# Patient Record
Sex: Female | Born: 1955 | Race: White | Hispanic: Yes | Marital: Married | Smoking: Never smoker
Health system: Southern US, Community
[De-identification: ages and names within clinical notes are randomized; demographics above are authoritative.]

## PROBLEM LIST (undated history)

## (undated) DIAGNOSIS — C50919 Malignant neoplasm of unspecified site of unspecified female breast: Secondary | ICD-10-CM

## (undated) DIAGNOSIS — E119 Type 2 diabetes mellitus without complications: Secondary | ICD-10-CM

## (undated) DIAGNOSIS — M1990 Unspecified osteoarthritis, unspecified site: Secondary | ICD-10-CM

## (undated) DIAGNOSIS — D649 Anemia, unspecified: Secondary | ICD-10-CM

## (undated) DIAGNOSIS — N189 Chronic kidney disease, unspecified: Secondary | ICD-10-CM

## (undated) HISTORY — PX: CHOLECYSTECTOMY: SHX55

---

## 1994-02-01 HISTORY — PX: MASTECTOMY, RADICAL: SHX710

## 2006-12-05 ENCOUNTER — Ambulatory Visit: Payer: Self-pay | Admitting: Gastroenterology

## 2014-11-26 ENCOUNTER — Other Ambulatory Visit: Payer: Self-pay | Admitting: Orthopedic Surgery

## 2014-11-26 DIAGNOSIS — M542 Cervicalgia: Secondary | ICD-10-CM

## 2014-12-02 ENCOUNTER — Encounter
Admission: RE | Admit: 2014-12-02 | Discharge: 2014-12-02 | Disposition: A | Discharge status: Home / Self Care | Payer: Managed Care, Other (non HMO) | Source: Ambulatory Visit | Attending: Orthopedic Surgery | Admitting: Orthopedic Surgery

## 2014-12-02 ENCOUNTER — Encounter: Admission: RE | Admit: 2014-12-02 | Payer: Managed Care, Other (non HMO) | Source: Ambulatory Visit

## 2014-12-02 DIAGNOSIS — M542 Cervicalgia: Secondary | ICD-10-CM | POA: Insufficient documentation

## 2014-12-02 HISTORY — DX: Malignant neoplasm of unspecified site of unspecified female breast: C50.919

## 2014-12-02 MED ORDER — TECHNETIUM TC 99M MEDRONATE IV KIT
25.0000 | PACK | Freq: Once | INTRAVENOUS | Status: AC | PRN
  Administered 2014-12-02: 22.53 via INTRAVENOUS

## 2019-04-13 ENCOUNTER — Ambulatory Visit: Payer: Self-pay | Attending: Internal Medicine

## 2019-04-13 DIAGNOSIS — Z23 Encounter for immunization: Secondary | ICD-10-CM

## 2019-04-13 NOTE — Progress Notes (Signed)
   Covid-19 Vaccination Clinic  Name:  Belinda Frazier    MRN: [id number] DOB: [birth year]  04/13/2019  [name] was observed post Covid-19 immunization for 15 minutes without incident. She was provided with Vaccine Information Sheet and instruction to access the V-Safe system.   [name] was instructed to call 911 with any severe reactions post vaccine: Marland Kitchen Difficulty breathing  . Swelling of face and throat  . A fast heartbeat  . A bad rash all over body  . Dizziness and weakness   Immunizations Administered    Name Date Dose VIS Date Route   Moderna COVID-19 Vaccine 04/13/2019 10:37 AM 0.5 mL 01/02/2019 Intramuscular   Manufacturer: Moderna   Lot: [id number]   [id number]

## 2019-05-15 ENCOUNTER — Ambulatory Visit: Payer: Self-pay | Attending: Internal Medicine

## 2019-05-15 DIAGNOSIS — Z23 Encounter for immunization: Secondary | ICD-10-CM

## 2019-05-15 NOTE — Progress Notes (Signed)
   Covid-19 Vaccination Clinic  Name:  Naysha Tuffy    MRN: [id number] DOB: 1955-04-04  05/15/2019  Ms. Bosque was observed post Covid-19 immunization for 15 minutes without incident. She was provided with Vaccine Information Sheet and instruction to access the V-Safe system.   Ms. Panepinto was instructed to call 911 with any severe reactions post vaccine: Marland Kitchen Difficulty breathing  . Swelling of face and throat  . A fast heartbeat  . A bad rash all over body  . Dizziness and weakness   Immunizations Administered    Name Date Dose VIS Date Route   Moderna COVID-19 Vaccine 05/15/2019 10:11 AM 0.5 mL 01/02/2019 Intramuscular   Manufacturer: Moderna   Lot: [id number]   [id number]

## 2019-10-26 ENCOUNTER — Other Ambulatory Visit: Payer: Self-pay

## 2019-10-26 ENCOUNTER — Other Ambulatory Visit: Payer: Self-pay | Admitting: Internal Medicine

## 2019-10-26 ENCOUNTER — Ambulatory Visit
Admission: RE | Admit: 2019-10-26 | Discharge: 2019-10-26 | Disposition: A | Discharge status: Home / Self Care | Payer: Medicare HMO | Source: Ambulatory Visit | Attending: Internal Medicine | Admitting: Internal Medicine

## 2019-10-26 DIAGNOSIS — R1084 Generalized abdominal pain: Secondary | ICD-10-CM | POA: Diagnosis not present

## 2019-10-26 LAB — POCT I-STAT CREATININE: Creatinine, Ser: 1 mg/dL (ref 0.44–1.00)

## 2019-10-26 MED ORDER — IOHEXOL 300 MG/ML  SOLN
100.0000 mL | Freq: Once | INTRAMUSCULAR | Status: AC | PRN
  Administered 2019-10-26: 100 mL via INTRAVENOUS

## 2021-01-20 ENCOUNTER — Other Ambulatory Visit: Payer: Self-pay | Admitting: Internal Medicine

## 2021-01-20 DIAGNOSIS — M79662 Pain in left lower leg: Secondary | ICD-10-CM

## 2021-01-20 DIAGNOSIS — E1165 Type 2 diabetes mellitus with hyperglycemia: Secondary | ICD-10-CM

## 2021-01-29 ENCOUNTER — Ambulatory Visit
Admission: RE | Admit: 2021-01-29 | Discharge: 2021-01-29 | Disposition: A | Discharge status: Home / Self Care | Payer: Medicare HMO | Source: Ambulatory Visit | Attending: Internal Medicine | Admitting: Internal Medicine

## 2021-01-29 DIAGNOSIS — M79662 Pain in left lower leg: Secondary | ICD-10-CM | POA: Diagnosis not present

## 2021-01-29 DIAGNOSIS — Z794 Long term (current) use of insulin: Secondary | ICD-10-CM | POA: Diagnosis present

## 2021-01-29 DIAGNOSIS — E1165 Type 2 diabetes mellitus with hyperglycemia: Secondary | ICD-10-CM | POA: Insufficient documentation

## 2021-09-05 LAB — EXTERNAL GENERIC LAB PROCEDURE: COLOGUARD: NEGATIVE

## 2021-09-05 LAB — COLOGUARD: COLOGUARD: NEGATIVE

## 2021-10-19 ENCOUNTER — Other Ambulatory Visit: Payer: Self-pay | Admitting: Nephrology

## 2021-10-19 ENCOUNTER — Other Ambulatory Visit
Admission: RE | Admit: 2021-10-19 | Discharge: 2021-10-19 | Disposition: A | Discharge status: Home / Self Care | Payer: Medicare HMO | Attending: Nephrology | Admitting: Nephrology

## 2021-10-19 DIAGNOSIS — E1122 Type 2 diabetes mellitus with diabetic chronic kidney disease: Secondary | ICD-10-CM | POA: Insufficient documentation

## 2021-10-19 DIAGNOSIS — N1832 Chronic kidney disease, stage 3b: Secondary | ICD-10-CM | POA: Insufficient documentation

## 2021-10-19 LAB — CBC WITH DIFFERENTIAL/PLATELET
Abs Immature Granulocytes: 0.01 10*3/uL (ref 0.00–0.07)
Basophils Absolute: 0.1 10*3/uL (ref 0.0–0.1)
Basophils Relative: 1 %
Eosinophils Absolute: 0.2 10*3/uL (ref 0.0–0.5)
Eosinophils Relative: 4 %
HCT: 31.9 % — ABNORMAL LOW (ref 36.0–46.0)
Hemoglobin: 10.5 g/dL — ABNORMAL LOW (ref 12.0–15.0)
Immature Granulocytes: 0 %
Lymphocytes Relative: 17 %
Lymphs Abs: 0.9 10*3/uL (ref 0.7–4.0)
MCH: 30.5 pg (ref 26.0–34.0)
MCHC: 32.9 g/dL (ref 30.0–36.0)
MCV: 92.7 fL (ref 80.0–100.0)
Monocytes Absolute: 0.4 10*3/uL (ref 0.1–1.0)
Monocytes Relative: 7 %
Neutro Abs: 3.7 10*3/uL (ref 1.7–7.7)
Neutrophils Relative %: 71 %
Platelets: 310 10*3/uL (ref 150–400)
RBC: 3.44 MIL/uL — ABNORMAL LOW (ref 3.87–5.11)
RDW: 14.6 % (ref 11.5–15.5)
WBC: 5.2 10*3/uL (ref 4.0–10.5)
nRBC: 0 % (ref 0.0–0.2)

## 2021-10-19 LAB — RENAL FUNCTION PANEL
Albumin: 4.2 g/dL (ref 3.5–5.0)
Anion gap: 7 (ref 5–15)
BUN: 39 mg/dL — ABNORMAL HIGH (ref 8–23)
CO2: 23 mmol/L (ref 22–32)
Calcium: 9.5 mg/dL (ref 8.9–10.3)
Chloride: 109 mmol/L (ref 98–111)
Creatinine, Ser: 1.32 mg/dL — ABNORMAL HIGH (ref 0.44–1.00)
GFR, Estimated: 44 mL/min — ABNORMAL LOW (ref >60)
Glucose, Bld: 137 mg/dL — ABNORMAL HIGH (ref 70–99)
Phosphorus: 3.1 mg/dL (ref 2.5–4.6)
Potassium: 4.7 mmol/L (ref 3.5–5.1)
Sodium: 139 mmol/L (ref 135–145)

## 2021-10-20 LAB — MICROALBUMIN / CREATININE URINE RATIO
Creatinine, Urine: 109.6 mg/dL
Microalb Creat Ratio: 12 mg/g creat (ref 0–29)
Microalb, Ur: 13.4 ug/mL — ABNORMAL HIGH

## 2021-10-21 LAB — PARATHYROID HORMONE, INTACT (NO CA): PTH: 22 pg/mL (ref 15–65)

## 2021-10-27 ENCOUNTER — Ambulatory Visit
Admission: RE | Admit: 2021-10-27 | Discharge: 2021-10-27 | Disposition: A | Discharge status: Home / Self Care | Payer: Medicare HMO | Source: Ambulatory Visit | Attending: Nephrology | Admitting: Nephrology

## 2021-10-27 DIAGNOSIS — N1832 Chronic kidney disease, stage 3b: Secondary | ICD-10-CM | POA: Diagnosis present

## 2021-10-27 DIAGNOSIS — E1122 Type 2 diabetes mellitus with diabetic chronic kidney disease: Secondary | ICD-10-CM | POA: Diagnosis not present

## 2022-12-08 NOTE — Patient Instructions (Addendum)
DUE TO COVID-19 ONLY TWO VISITORS  (aged 69 and older)  ARE ALLOWED TO COME WITH YOU AND STAY IN THE WAITING ROOM ONLY DURING PRE OP AND PROCEDURE.   **NO VISITORS ARE ALLOWED IN THE SHORT STAY AREA OR RECOVERY ROOM!!**  IF YOU WILL BE ADMITTED INTO THE HOSPITAL YOU ARE ALLOWED ONLY FOUR SUPPORT PEOPLE DURING VISITATION HOURS ONLY (7 AM -8PM)   The support person(s) must pass our screening, gel in and out, and wear a mask at all times, including in the patient's room. Patients must also wear a mask when staff or their support person are in the room. Visitors GUEST BADGE MUST BE WORN VISIBLY  One adult visitor may remain with you overnight and MUST be in the room by 8 P.M.     Your procedure is scheduled on: 12/21/22   Report to [facility] Main Entrance    Report to admitting at : 5:15 AM   Call this number if you have problems the morning of surgery [phone]   Do not eat food :After Midnight.   After Midnight you may have the following liquids until : 4:00 AM DAY OF SURGERY  Water Black Coffee (sugar ok, NO MILK/CREAM OR CREAMERS)  Tea (sugar ok, NO MILK/CREAM OR CREAMERS) regular and decaf                             Plain Jell-O (NO RED)                                           Fruit ices (not with fruit pulp, NO RED)                                     Popsicles (NO RED)                                                                  Juice: apple, WHITE grape, WHITE cranberry Sports drinks like Gatorade (NO RED)   The day of surgery:  Drink ONE (1) Pre-Surgery Clear G2 at : 4;00 AM the morning of surgery. Drink in one sitting. Do not sip.  This drink was given to you during your hospital  pre-op appointment visit. Nothing else to drink after completing the  Pre-Surgery Clear Ensure or G2.          If you have questions, please contact your surgeon's office.  FOLLOW  ANY ADDITIONAL PRE OP INSTRUCTIONS YOU RECEIVED FROM YOUR SURGEON'S OFFICE!!!   Oral  Hygiene is also important to reduce your risk of infection.                                    Remember - BRUSH YOUR TEETH THE MORNING OF SURGERY WITH YOUR REGULAR TOOTHPASTE  DENTURES WILL BE REMOVED PRIOR TO SURGERY PLEASE DO NOT APPLY "Poly grip" OR ADHESIVES!!!   Do NOT smoke after Midnight   Take these medicines the morning of  surgery with A SIP OF WATER: NONE How to Manage Your Diabetes Before and After Surgery  Why is it important to control my blood sugar before and after surgery? Improving blood sugar levels before and after surgery helps healing and can limit problems. A way of improving blood sugar control is eating a healthy diet by:  Eating less sugar and carbohydrates  Increasing activity/exercise  Talking with your doctor about reaching your blood sugar goals High blood sugars (greater than 180 mg/dL) can raise your risk of infections and slow your recovery, so you will need to focus on controlling your diabetes during the weeks before surgery. Make sure that the doctor who takes care of your diabetes knows about your planned surgery including the date and location.  How do I manage my blood sugar before surgery? Check your blood sugar at least 4 times a day, starting 2 days before surgery, to make sure that the level is not too high or low. Check your blood sugar the morning of your surgery when you wake up and every 2 hours until you get to the Short Stay unit. If your blood sugar is less than 70 mg/dL, you will need to treat for low blood sugar: Do not take insulin. Treat a low blood sugar (less than 70 mg/dL) with  cup of clear juice (cranberry or apple), 4 glucose tablets, OR glucose gel. Recheck blood sugar in 15 minutes after treatment (to make sure it is greater than 70 mg/dL). If your blood sugar is not greater than 70 mg/dL on recheck, call [phone] for further instructions. Report your blood sugar to the short stay nurse when you get to Short Stay.  If you  are admitted to the hospital after surgery: Your blood sugar will be checked by the staff and you will probably be given insulin after surgery (instead of oral diabetes medicines) to make sure you have good blood sugar levels. The goal for blood sugar control after surgery is 80-180 mg/dL.   WHAT DO I DO ABOUT MY DIABETES MEDICATION?      THE MORNING OF SURGERY, DO NOT TAKE ANY ORAL DIABETIC MEDICATIONS DAY OF YOUR SURGERY  DO NOT TAKE THE FOLLOWING 7 DAYS PRIOR TO SURGERY: Ozempic, Wegovy, Rybelsus (Semaglutide), Byetta (exenatide), Bydureon (exenatide ER), Victoza, Saxenda (liraglutide), or Trulicity (dulaglutide) Mounjaro (Tirzepatide) Adlyxin (Lixisenatide), Polyethylene Glycol Loxenatide. HOLD Mounjaro after: 12/11/22 .                              You may not have any metal on your body including hair pins, jewelry, and body piercing             Do not wear make-up, lotions, powders, perfumes/cologne, or deodorant  Do not wear nail polish including gel and S&S, artificial/acrylic nails, or any other type of covering on natural nails including finger and toenails. If you have artificial nails, gel coating, etc. that needs to be removed by a nail salon please have this removed prior to surgery or surgery may need to be canceled/ delayed if the surgeon/ anesthesia feels like they are unable to be safely monitored.   Do not shave  48 hours prior to surgery.    Do not bring valuables to the hospital. [facility] IS NOT             RESPONSIBLE   FOR VALUABLES.   Contacts, glasses, or bridgework may not be worn into surgery.  Bring small overnight bag day of surgery.   DO NOT BRING YOUR HOME MEDICATIONS TO THE HOSPITAL. PHARMACY WILL DISPENSE MEDICATIONS LISTED ON YOUR MEDICATION LIST TO YOU DURING YOUR ADMISSION IN THE HOSPITAL!    Patients discharged on the day of surgery will not be allowed to drive home.  Someone NEEDS to stay with you for the first 24 hours after  anesthesia.   Special Instructions: Bring a copy of your healthcare power of attorney and living will documents         the day of surgery if you haven't scanned them before.              Please read over the following fact sheets you were given: IF YOU HAVE QUESTIONS ABOUT YOUR PRE-OP INSTRUCTIONS PLEASE CALL [phone]      Pre-operative 5 CHG Bath Instructions   You can play a key role in reducing the risk of infection after surgery. Your skin needs to be as free of germs as possible. You can reduce the number of germs on your skin by washing with CHG (chlorhexidine gluconate) soap before surgery. CHG is an antiseptic soap that kills germs and continues to kill germs even after washing.   DO NOT use if you have an allergy to chlorhexidine/CHG or antibacterial soaps. If your skin becomes reddened or irritated, stop using the CHG and notify one of our RNs at : [phone].   Please shower with the CHG soap starting 4 days before surgery using the following schedule:     Please keep in mind the following:  DO NOT shave, including legs and underarms, starting the day of your first shower.   You may shave your face at any point before/day of surgery.  Place clean sheets on your bed the day you start using CHG soap. Use a clean washcloth (not used since being washed) for each shower. DO NOT sleep with pets once you start using the CHG.   CHG Shower Instructions:  If you choose to wash your hair and private area, wash first with your normal shampoo/soap.  After you use shampoo/soap, rinse your hair and body thoroughly to remove shampoo/soap residue.  Turn the water OFF and apply about 3 tablespoons (45 ml) of CHG soap to a CLEAN washcloth.  Apply CHG soap ONLY FROM YOUR NECK DOWN TO YOUR TOES (washing for 3-5 minutes)  DO NOT use CHG soap on face, private areas, open wounds, or sores.  Pay special attention to the area where your surgery is being performed.  If you are having back  surgery, having someone wash your back for you may be helpful. Wait 2 minutes after CHG soap is applied, then you may rinse off the CHG soap.  Pat dry with a clean towel  Put on clean clothes/pajamas   If you choose to wear lotion, please use ONLY the CHG-compatible lotions on the back of this paper.     Additional instructions for the day of surgery: DO NOT APPLY any lotions, deodorants, cologne, or perfumes.   Put on clean/comfortable clothes.  Brush your teeth.  Ask your nurse before applying any prescription medications to the skin.   CHG Compatible Lotions   Aveeno Moisturizing lotion  Cetaphil Moisturizing Cream  Cetaphil Moisturizing Lotion  Clairol Herbal Essence Moisturizing Lotion, Dry Skin  Clairol Herbal Essence Moisturizing Lotion, Extra Dry Skin  Clairol Herbal Essence Moisturizing Lotion, Normal Skin  Curel Age Defying Therapeutic Moisturizing Lotion with Alpha Hydroxy  Curel Extreme  Care Body Lotion  Curel Soothing Hands Moisturizing Hand Lotion  Curel Therapeutic Moisturizing Cream, Fragrance-Free  Curel Therapeutic Moisturizing Lotion, Fragrance-Free  Curel Therapeutic Moisturizing Lotion, Original Formula  Eucerin Daily Replenishing Lotion  Eucerin Dry Skin Therapy Plus Alpha Hydroxy Crme  Eucerin Dry Skin Therapy Plus Alpha Hydroxy Lotion  Eucerin Original Crme  Eucerin Original Lotion  Eucerin Plus Crme Eucerin Plus Lotion  Eucerin TriLipid Replenishing Lotion  Keri Anti-Bacterial Hand Lotion  Keri Deep Conditioning Original Lotion Dry Skin Formula Softly Scented  Keri Deep Conditioning Original Lotion, Fragrance Free Sensitive Skin Formula  Keri Lotion Fast Absorbing Fragrance Free Sensitive Skin Formula  Keri Lotion Fast Absorbing Softly Scented Dry Skin Formula  Keri Original Lotion  Keri Skin Renewal Lotion Keri Silky Smooth Lotion  Keri Silky Smooth Sensitive Skin Lotion  Nivea Body Creamy Conditioning Oil  Nivea Body Extra Enriched Lotion   Nivea Body Original Lotion  Nivea Body Sheer Moisturizing Lotion Nivea Crme  Nivea Skin Firming Lotion  NutraDerm 30 Skin Lotion  NutraDerm Skin Lotion  NutraDerm Therapeutic Skin Cream  NutraDerm Therapeutic Skin Lotion  ProShield Protective Hand Cream  Provon moisturizing lotion   Incentive Spirometer  An incentive spirometer is a tool that can help keep your lungs clear and active. This tool measures how well you are filling your lungs with each breath. Taking long deep breaths may help reverse or decrease the chance of developing breathing (pulmonary) problems (especially infection) following: A long period of time when you are unable to move or be active. BEFORE THE PROCEDURE  If the spirometer includes an indicator to show your best effort, your nurse or respiratory therapist will set it to a desired goal. If possible, sit up straight or lean slightly forward. Try not to slouch. Hold the incentive spirometer in an upright position. INSTRUCTIONS FOR USE  Sit on the edge of your bed if possible, or sit up as far as you can in bed or on a chair. Hold the incentive spirometer in an upright position. Breathe out normally. Place the mouthpiece in your mouth and seal your lips tightly around it. Breathe in slowly and as deeply as possible, raising the piston or the ball toward the top of the column. Hold your breath for 3-5 seconds or for as long as possible. Allow the piston or ball to fall to the bottom of the column. Remove the mouthpiece from your mouth and breathe out normally. Rest for a few seconds and repeat Steps 1 through 7 at least 10 times every 1-2 hours when you are awake. Take your time and take a few normal breaths between deep breaths. The spirometer may include an indicator to show your best effort. Use the indicator as a goal to work toward during each repetition. After each set of 10 deep breaths, practice coughing to be sure your lungs are clear. If you have an  incision (the cut made at the time of surgery), support your incision when coughing by placing a pillow or rolled up towels firmly against it. Once you are able to get out of bed, walk around indoors and cough well. You may stop using the incentive spirometer when instructed by your caregiver.  RISKS AND COMPLICATIONS Take your time so you do not get dizzy or light-headed. If you are in pain, you may need to take or ask for pain medication before doing incentive spirometry. It is harder to take a deep breath if you are having pain. AFTER USE Rest and breathe  slowly and easily. It can be helpful to keep track of a log of your progress. Your caregiver can provide you with a simple table to help with this. If you are using the spirometer at home, follow these instructions: SEEK MEDICAL CARE IF:  You are having difficultly using the spirometer. You have trouble using the spirometer as often as instructed. Your pain medication is not giving enough relief while using the spirometer. You develop fever of 100.5 F (38.1 C) or higher. SEEK IMMEDIATE MEDICAL CARE IF:  You cough up bloody sputum that had not been present before. You develop fever of 102 F (38.9 C) or greater. You develop worsening pain at or near the incision site. MAKE SURE YOU:  Understand these instructions. Will watch your condition. Will get help right away if you are not doing well or get worse. Document Released: 05/31/2006 Document Revised: 04/12/2011 Document Reviewed: 08/01/2006 [facility] Patient Information 2014 [place], [place].   ________________________________________________________________________

## 2022-12-09 ENCOUNTER — Encounter (HOSPITAL_COMMUNITY): Payer: Self-pay

## 2022-12-09 ENCOUNTER — Encounter (HOSPITAL_COMMUNITY)
Admission: RE | Admit: 2022-12-09 | Discharge: 2022-12-09 | Disposition: A | Discharge status: Home / Self Care | Payer: Medicare HMO | Source: Ambulatory Visit | Attending: Orthopedic Surgery | Admitting: Orthopedic Surgery

## 2022-12-09 ENCOUNTER — Other Ambulatory Visit: Payer: Self-pay

## 2022-12-09 VITALS — BP 127/61 | HR 80 | Temp 97.9°F | Ht 59.0 in | Wt 126.0 lb

## 2022-12-09 DIAGNOSIS — Z01818 Encounter for other preprocedural examination: Secondary | ICD-10-CM | POA: Insufficient documentation

## 2022-12-09 DIAGNOSIS — Z794 Long term (current) use of insulin: Secondary | ICD-10-CM | POA: Diagnosis not present

## 2022-12-09 DIAGNOSIS — I444 Left anterior fascicular block: Secondary | ICD-10-CM | POA: Diagnosis not present

## 2022-12-09 DIAGNOSIS — E119 Type 2 diabetes mellitus without complications: Secondary | ICD-10-CM | POA: Diagnosis not present

## 2022-12-09 DIAGNOSIS — I10 Essential (primary) hypertension: Secondary | ICD-10-CM

## 2022-12-09 HISTORY — DX: Anemia, unspecified: D64.9

## 2022-12-09 HISTORY — DX: Unspecified osteoarthritis, unspecified site: M19.90

## 2022-12-09 HISTORY — DX: Chronic kidney disease, unspecified: N18.9

## 2022-12-09 HISTORY — DX: Type 2 diabetes mellitus without complications: E11.9

## 2022-12-09 LAB — GLUCOSE, CAPILLARY: Glucose-Capillary: 119 mg/dL — ABNORMAL HIGH (ref 70–99)

## 2022-12-09 LAB — SURGICAL PCR SCREEN
MRSA, PCR: NEGATIVE
Staphylococcus aureus: NEGATIVE

## 2022-12-09 NOTE — Progress Notes (Addendum)
For Anesthesia: PCP - Enid Baas, MD  Cardiologist -   Bowel Prep reminder:  Chest x-ray -  EKG - 12/09/22 Stress Test -  ECHO -  Cardiac Cath -  Pacemaker/ICD device last checked: Pacemaker orders received: Device Rep notified:  Spinal Cord Stimulator: N/A  Sleep Study - N/A CPAP -   Fasting Blood Sugar - 80' - 100's Checks Blood Sugar __1___ times a day Date and result of last Hgb A1c- 6 : 11/26/22  Last dose of GLP1 agonist- mounjaro. GLP1 instructions: To hold it after 12/11/22 dose.  Last dose of SGLT-2 inhibitors- N/A SGLT-2 instructions:   Blood Thinner Instructions: N/A Aspirin Instructions: To hold 5 days before surgery. Last Dose:  Activity level: Can go up a flight of stairs and activities of daily living without stopping and without chest pain and/or shortness of breath   Able to exercise without chest pain and/or shortness of breath  Anesthesia review: Hx: DIA,CKD III  Patient denies shortness of breath, fever, cough and chest pain at PAT appointment   Patient verbalized understanding of instructions that were given to them at the PAT appointment. Patient was also instructed that they will need to review over the PAT instructions again at home before surgery.

## 2022-12-20 NOTE — Anesthesia Preprocedure Evaluation (Signed)
Anesthesia Evaluation  Patient identified by MRN, date of birth, ID band Patient awake    Reviewed: Allergy & Precautions, NPO status , Patient's Chart, lab work & pertinent test results  Airway Mallampati: II  TM Distance: >3 FB Neck ROM: Full    Dental  (+) Teeth Intact, Dental Advisory Given, Caps,    Pulmonary neg pulmonary ROS   Pulmonary exam normal breath sounds clear to auscultation       Cardiovascular hypertension, Pt. on medications Normal cardiovascular exam Rhythm:Regular Rate:Normal     Neuro/Psych negative neurological ROS     GI/Hepatic negative GI ROS, Neg liver ROS,,,  Endo/Other  diabetes, Type 2, Oral Hypoglycemic Agents    Renal/GU Renal InsufficiencyRenal disease     Musculoskeletal  (+) Arthritis ,    Abdominal   Peds  Hematology negative hematology ROS (+)   Anesthesia Other Findings S/p bilateral mastectomy  Reproductive/Obstetrics                             Anesthesia Physical Anesthesia Plan  ASA: 2  Anesthesia Plan: Spinal   Post-op Pain Management: Regional block* and Tylenol PO (pre-op)*   Induction: Intravenous  PONV Risk Score and Plan: 2 and Midazolam, Dexamethasone, Ondansetron and TIVA  Airway Management Planned: Natural Airway and Simple Face Mask  Additional Equipment:   Intra-op Plan:   Post-operative Plan:   Informed Consent: I have reviewed the patients History and Physical, chart, labs and discussed the procedure including the risks, benefits and alternatives for the proposed anesthesia with the patient or authorized representative who has indicated his/her understanding and acceptance.     Dental advisory given  Plan Discussed with: CRNA  Anesthesia Plan Comments:        Anesthesia Quick Evaluation

## 2022-12-21 ENCOUNTER — Encounter (HOSPITAL_COMMUNITY): Payer: Self-pay | Admitting: Orthopedic Surgery

## 2022-12-21 ENCOUNTER — Observation Stay (HOSPITAL_COMMUNITY)
Admission: RE | Admit: 2022-12-21 | Discharge: 2022-12-22 | Disposition: A | Discharge status: Home / Self Care | Payer: Medicare HMO | Source: Ambulatory Visit | Attending: Orthopedic Surgery | Admitting: Orthopedic Surgery

## 2022-12-21 ENCOUNTER — Encounter (HOSPITAL_COMMUNITY)
Admission: RE | Disposition: A | Discharge status: Home / Self Care | Payer: Self-pay | Source: Ambulatory Visit | Attending: Orthopedic Surgery

## 2022-12-21 ENCOUNTER — Ambulatory Visit (HOSPITAL_COMMUNITY): Payer: Medicare HMO | Admitting: Anesthesiology

## 2022-12-21 ENCOUNTER — Other Ambulatory Visit: Payer: Self-pay

## 2022-12-21 ENCOUNTER — Ambulatory Visit (HOSPITAL_COMMUNITY): Payer: Self-pay | Admitting: Anesthesiology

## 2022-12-21 DIAGNOSIS — M1712 Unilateral primary osteoarthritis, left knee: Secondary | ICD-10-CM | POA: Diagnosis present

## 2022-12-21 DIAGNOSIS — E1122 Type 2 diabetes mellitus with diabetic chronic kidney disease: Secondary | ICD-10-CM | POA: Insufficient documentation

## 2022-12-21 DIAGNOSIS — N183 Chronic kidney disease, stage 3 unspecified: Secondary | ICD-10-CM | POA: Insufficient documentation

## 2022-12-21 DIAGNOSIS — Z853 Personal history of malignant neoplasm of breast: Secondary | ICD-10-CM | POA: Insufficient documentation

## 2022-12-21 DIAGNOSIS — Z96652 Presence of left artificial knee joint: Principal | ICD-10-CM

## 2022-12-21 HISTORY — PX: TOTAL KNEE ARTHROPLASTY: SHX125

## 2022-12-21 LAB — GLUCOSE, CAPILLARY
Glucose-Capillary: 118 mg/dL — ABNORMAL HIGH (ref 70–99)
Glucose-Capillary: 99 mg/dL (ref 70–99)
Glucose-Capillary: 124 mg/dL — ABNORMAL HIGH (ref 70–99)
Glucose-Capillary: 165 mg/dL — ABNORMAL HIGH (ref 70–99)
Glucose-Capillary: 316 mg/dL — ABNORMAL HIGH (ref 70–99)
Glucose-Capillary: 262 mg/dL — ABNORMAL HIGH (ref 70–99)

## 2022-12-21 LAB — HEMOGLOBIN A1C
Hgb A1c MFr Bld: 5.9 % — ABNORMAL HIGH (ref 4.8–5.6)
Mean Plasma Glucose: 122.63 mg/dL

## 2022-12-21 SURGERY — ARTHROPLASTY, KNEE, TOTAL
Anesthesia: Spinal | Site: Knee | Laterality: Left

## 2022-12-21 MED ORDER — ALUM & MAG HYDROXIDE-SIMETH 200-200-20 MG/5ML PO SUSP: 30.0000 mL | ORAL | Status: DC | PRN

## 2022-12-21 MED ORDER — CEFAZOLIN SODIUM-DEXTROSE 2-4 GM/100ML-% IV SOLN
2.0000 g | INTRAVENOUS | Status: AC
  Administered 2022-12-21: 2 g via INTRAVENOUS
  Filled 2022-12-21: qty 100

## 2022-12-21 MED ORDER — PHENYLEPHRINE HCL-NACL 20-0.9 MG/250ML-% IV SOLN
INTRAVENOUS | Status: AC
  Filled 2022-12-21: qty 250

## 2022-12-21 MED ORDER — LACTATED RINGERS IV SOLN: INTRAVENOUS | Status: DC

## 2022-12-21 MED ORDER — TRANEXAMIC ACID-NACL 1000-0.7 MG/100ML-% IV SOLN
1000.0000 mg | INTRAVENOUS | Status: AC
  Administered 2022-12-21: 1000 mg via INTRAVENOUS
  Filled 2022-12-21: qty 100

## 2022-12-21 MED ORDER — SODIUM CHLORIDE 0.9% FLUSH
10.0000 mL | Freq: Two times a day (BID) | INTRAVENOUS | Status: DC
  Administered 2022-12-22: 10 mL via INTRAVENOUS

## 2022-12-21 MED ORDER — PROPOFOL 1000 MG/100ML IV EMUL
INTRAVENOUS | Status: AC
  Filled 2022-12-21: qty 100

## 2022-12-21 MED ORDER — ONDANSETRON HCL 4 MG PO TABS: 4.0000 mg | ORAL_TABLET | Freq: Four times a day (QID) | ORAL | Status: DC | PRN

## 2022-12-21 MED ORDER — MIDAZOLAM HCL 5 MG/5ML IJ SOLN
INTRAMUSCULAR | Status: DC | PRN
  Administered 2022-12-21 (×2): 1 mg via INTRAVENOUS

## 2022-12-21 MED ORDER — STERILE WATER FOR IRRIGATION IR SOLN
Status: DC | PRN
  Administered 2022-12-21: 1000 mL

## 2022-12-21 MED ORDER — SODIUM CHLORIDE (PF) 0.9 % IJ SOLN
INTRAMUSCULAR | Status: DC | PRN
  Administered 2022-12-21: 30 mL

## 2022-12-21 MED ORDER — CEFAZOLIN SODIUM-DEXTROSE 2-4 GM/100ML-% IV SOLN
2.0000 g | Freq: Four times a day (QID) | INTRAVENOUS | Status: AC
  Administered 2022-12-21 (×2): 2 g via INTRAVENOUS
  Filled 2022-12-21 (×2): qty 100

## 2022-12-21 MED ORDER — KETOROLAC TROMETHAMINE 30 MG/ML IJ SOLN
INTRAMUSCULAR | Status: AC
  Filled 2022-12-21: qty 1

## 2022-12-21 MED ORDER — DEXAMETHASONE SODIUM PHOSPHATE 10 MG/ML IJ SOLN
INTRAMUSCULAR | Status: AC
  Filled 2022-12-21: qty 1

## 2022-12-21 MED ORDER — 0.9 % SODIUM CHLORIDE (POUR BTL) OPTIME
TOPICAL | Status: DC | PRN
  Administered 2022-12-21: 1000 mL

## 2022-12-21 MED ORDER — ONDANSETRON HCL 4 MG/2ML IJ SOLN: 4.0000 mg | Freq: Once | INTRAMUSCULAR | Status: DC | PRN

## 2022-12-21 MED ORDER — BUPIVACAINE IN DEXTROSE 0.75-8.25 % IT SOLN
INTRATHECAL | Status: DC | PRN
  Administered 2022-12-21: 1.4 mL via INTRATHECAL

## 2022-12-21 MED ORDER — GLIPIZIDE 5 MG PO TABS
5.0000 mg | ORAL_TABLET | Freq: Every day | ORAL | Status: DC
  Administered 2022-12-22: 5 mg via ORAL
  Filled 2022-12-21: qty 1

## 2022-12-21 MED ORDER — METHOCARBAMOL 1000 MG/10ML IJ SOLN: 500.0000 mg | Freq: Four times a day (QID) | INTRAMUSCULAR | Status: DC | PRN

## 2022-12-21 MED ORDER — OXYCODONE HCL 5 MG PO TABS
5.0000 mg | ORAL_TABLET | ORAL | Status: DC | PRN
  Administered 2022-12-21 – 2022-12-22 (×2): 5 mg via ORAL
  Filled 2022-12-21 (×2): qty 2
  Filled 2022-12-21: qty 1

## 2022-12-21 MED ORDER — DIPHENHYDRAMINE HCL 12.5 MG/5ML PO ELIX: 12.5000 mg | ORAL_SOLUTION | ORAL | Status: DC | PRN

## 2022-12-21 MED ORDER — SODIUM CHLORIDE (PF) 0.9 % IJ SOLN
INTRAMUSCULAR | Status: AC
  Filled 2022-12-21: qty 30

## 2022-12-21 MED ORDER — FENTANYL CITRATE PF 50 MCG/ML IJ SOSY: 25.0000 ug | PREFILLED_SYRINGE | INTRAMUSCULAR | Status: DC | PRN

## 2022-12-21 MED ORDER — SODIUM CHLORIDE 0.9 % IR SOLN
Status: DC | PRN
  Administered 2022-12-21: 1

## 2022-12-21 MED ORDER — ACETAMINOPHEN 500 MG PO TABS
1000.0000 mg | ORAL_TABLET | Freq: Once | ORAL | Status: AC
  Administered 2022-12-21: 1000 mg via ORAL
  Filled 2022-12-21: qty 2

## 2022-12-21 MED ORDER — BUPIVACAINE-EPINEPHRINE (PF) 0.25% -1:200000 IJ SOLN
INTRAMUSCULAR | Status: DC | PRN
  Administered 2022-12-21: 30 mL

## 2022-12-21 MED ORDER — GABAPENTIN 300 MG PO CAPS
300.0000 mg | ORAL_CAPSULE | Freq: Every day | ORAL | Status: DC
  Administered 2022-12-21: 300 mg via ORAL
  Filled 2022-12-21: qty 1

## 2022-12-21 MED ORDER — ACETAMINOPHEN 500 MG PO TABS
1000.0000 mg | ORAL_TABLET | Freq: Four times a day (QID) | ORAL | Status: DC
  Administered 2022-12-21 – 2022-12-22 (×3): 1000 mg via ORAL
  Filled 2022-12-21 (×3): qty 2

## 2022-12-21 MED ORDER — METOCLOPRAMIDE HCL 5 MG PO TABS: 5.0000 mg | ORAL_TABLET | Freq: Three times a day (TID) | ORAL | Status: DC | PRN

## 2022-12-21 MED ORDER — SENNA 8.6 MG PO TABS
2.0000 | ORAL_TABLET | Freq: Every day | ORAL | Status: DC
  Administered 2022-12-21: 17.2 mg via ORAL
  Filled 2022-12-21: qty 2

## 2022-12-21 MED ORDER — BISACODYL 10 MG RE SUPP: 10.0000 mg | Freq: Every day | RECTAL | Status: DC | PRN

## 2022-12-21 MED ORDER — OXYCODONE HCL 5 MG PO TABS
10.0000 mg | ORAL_TABLET | ORAL | Status: DC | PRN
  Administered 2022-12-21: 10 mg via ORAL

## 2022-12-21 MED ORDER — DEXAMETHASONE SODIUM PHOSPHATE 10 MG/ML IJ SOLN
10.0000 mg | Freq: Once | INTRAMUSCULAR | Status: AC
  Administered 2022-12-22: 10 mg via INTRAVENOUS
  Filled 2022-12-21: qty 1

## 2022-12-21 MED ORDER — CHLORHEXIDINE GLUCONATE 0.12 % MT SOLN
15.0000 mL | Freq: Once | OROMUCOSAL | Status: AC
  Administered 2022-12-21: 15 mL via OROMUCOSAL

## 2022-12-21 MED ORDER — PIOGLITAZONE HCL 15 MG PO TABS
15.0000 mg | ORAL_TABLET | Freq: Every morning | ORAL | Status: DC
  Administered 2022-12-22: 15 mg via ORAL
  Filled 2022-12-21: qty 1

## 2022-12-21 MED ORDER — FENTANYL CITRATE (PF) 100 MCG/2ML IJ SOLN
INTRAMUSCULAR | Status: AC
  Filled 2022-12-21: qty 2

## 2022-12-21 MED ORDER — ORAL CARE MOUTH RINSE: 15.0000 mL | Freq: Once | OROMUCOSAL | Status: AC

## 2022-12-21 MED ORDER — DEXAMETHASONE SODIUM PHOSPHATE 10 MG/ML IJ SOLN: 8.0000 mg | Freq: Once | INTRAMUSCULAR | Status: DC

## 2022-12-21 MED ORDER — METHOCARBAMOL 500 MG PO TABS
500.0000 mg | ORAL_TABLET | Freq: Four times a day (QID) | ORAL | Status: DC | PRN
  Administered 2022-12-21 – 2022-12-22 (×3): 500 mg via ORAL
  Filled 2022-12-21 (×3): qty 1

## 2022-12-21 MED ORDER — ROSUVASTATIN CALCIUM 10 MG PO TABS
10.0000 mg | ORAL_TABLET | Freq: Every morning | ORAL | Status: DC
  Administered 2022-12-22: 10 mg via ORAL
  Filled 2022-12-21: qty 1

## 2022-12-21 MED ORDER — HYDROMORPHONE HCL 1 MG/ML IJ SOLN: 0.5000 mg | INTRAMUSCULAR | Status: DC | PRN

## 2022-12-21 MED ORDER — ONDANSETRON HCL 4 MG/2ML IJ SOLN
INTRAMUSCULAR | Status: DC | PRN
  Administered 2022-12-21: 4 mg via INTRAVENOUS

## 2022-12-21 MED ORDER — ONDANSETRON HCL 4 MG/2ML IJ SOLN
INTRAMUSCULAR | Status: AC
  Filled 2022-12-21: qty 2

## 2022-12-21 MED ORDER — MENTHOL 3 MG MT LOZG: 1.0000 | LOZENGE | OROMUCOSAL | Status: DC | PRN

## 2022-12-21 MED ORDER — DEXAMETHASONE SODIUM PHOSPHATE 10 MG/ML IJ SOLN
INTRAMUSCULAR | Status: DC | PRN
  Administered 2022-12-21: 10 mg
  Administered 2022-12-21: 8 mg

## 2022-12-21 MED ORDER — POVIDONE-IODINE 10 % EX SWAB: 2.0000 | Freq: Once | CUTANEOUS | Status: DC

## 2022-12-21 MED ORDER — POLYETHYLENE GLYCOL 3350 17 G PO PACK
17.0000 g | PACK | Freq: Two times a day (BID) | ORAL | Status: DC
  Administered 2022-12-21 – 2022-12-22 (×2): 17 g via ORAL
  Filled 2022-12-21 (×3): qty 1

## 2022-12-21 MED ORDER — PHENYLEPHRINE HCL-NACL 20-0.9 MG/250ML-% IV SOLN
INTRAVENOUS | Status: DC | PRN
  Administered 2022-12-21: 40 ug/min via INTRAVENOUS

## 2022-12-21 MED ORDER — ONDANSETRON HCL 4 MG/2ML IJ SOLN: 4.0000 mg | Freq: Four times a day (QID) | INTRAMUSCULAR | Status: DC | PRN

## 2022-12-21 MED ORDER — METFORMIN HCL 500 MG PO TABS
500.0000 mg | ORAL_TABLET | Freq: Two times a day (BID) | ORAL | Status: DC
  Administered 2022-12-22: 500 mg via ORAL
  Filled 2022-12-21: qty 1

## 2022-12-21 MED ORDER — ROPIVACAINE HCL 5 MG/ML IJ SOLN
INTRAMUSCULAR | Status: DC | PRN
  Administered 2022-12-21: 20 mL via PERINEURAL

## 2022-12-21 MED ORDER — LOSARTAN POTASSIUM 25 MG PO TABS: 25.0000 mg | ORAL_TABLET | Freq: Every morning | ORAL | Status: DC

## 2022-12-21 MED ORDER — INSULIN ASPART 100 UNIT/ML IJ SOLN
0.0000 [IU] | Freq: Three times a day (TID) | INTRAMUSCULAR | Status: DC
  Administered 2022-12-21: 11 [IU] via SUBCUTANEOUS

## 2022-12-21 MED ORDER — PHENOL 1.4 % MT LIQD: 1.0000 | OROMUCOSAL | Status: DC | PRN

## 2022-12-21 MED ORDER — FENOFIBRATE 54 MG PO TABS
54.0000 mg | ORAL_TABLET | Freq: Every day | ORAL | Status: DC
  Administered 2022-12-21: 54 mg via ORAL
  Filled 2022-12-21 (×2): qty 1

## 2022-12-21 MED ORDER — PROPOFOL 10 MG/ML IV BOLUS
INTRAVENOUS | Status: DC | PRN
  Administered 2022-12-21: 30 mg via INTRAVENOUS
  Administered 2022-12-21: 80 ug/kg/min via INTRAVENOUS

## 2022-12-21 MED ORDER — FENTANYL CITRATE (PF) 100 MCG/2ML IJ SOLN
INTRAMUSCULAR | Status: DC | PRN
  Administered 2022-12-21: 50 ug via INTRAVENOUS

## 2022-12-21 MED ORDER — LACTATED RINGERS IV SOLN: INTRAVENOUS | Status: DC | PRN

## 2022-12-21 MED ORDER — MELOXICAM 15 MG PO TABS
15.0000 mg | ORAL_TABLET | Freq: Every day | ORAL | Status: DC
  Administered 2022-12-21: 15 mg via ORAL
  Filled 2022-12-21 (×2): qty 1

## 2022-12-21 MED ORDER — KETOROLAC TROMETHAMINE 30 MG/ML IJ SOLN
INTRAMUSCULAR | Status: DC | PRN
  Administered 2022-12-21: 30 mg

## 2022-12-21 MED ORDER — MIDAZOLAM HCL 2 MG/2ML IJ SOLN
INTRAMUSCULAR | Status: AC
Start: 2022-12-21 — End: ?
  Filled 2022-12-21: qty 2

## 2022-12-21 MED ORDER — METOCLOPRAMIDE HCL 5 MG/ML IJ SOLN: 5.0000 mg | Freq: Three times a day (TID) | INTRAMUSCULAR | Status: DC | PRN

## 2022-12-21 MED ORDER — BUPIVACAINE-EPINEPHRINE 0.25% -1:200000 IJ SOLN
INTRAMUSCULAR | Status: AC
  Filled 2022-12-21: qty 1

## 2022-12-21 MED ORDER — TRANEXAMIC ACID-NACL 1000-0.7 MG/100ML-% IV SOLN
1000.0000 mg | Freq: Once | INTRAVENOUS | Status: AC
  Administered 2022-12-21: 1000 mg via INTRAVENOUS
  Filled 2022-12-21: qty 100

## 2022-12-21 MED ORDER — ASPIRIN 81 MG PO CHEW
81.0000 mg | CHEWABLE_TABLET | Freq: Two times a day (BID) | ORAL | Status: DC
  Administered 2022-12-21 – 2022-12-22 (×2): 81 mg via ORAL
  Filled 2022-12-21 (×2): qty 1

## 2022-12-21 SURGICAL SUPPLY — 55 items
ATTUNE MED ANAT PAT 32 KNEE (Knees) IMPLANT
BAG COUNTER SPONGE SURGICOUNT (BAG) IMPLANT
BAG ZIPLOCK 12X15 (MISCELLANEOUS) IMPLANT
BASEPLATE TIB CMT FB PCKT SZ2 (Knees) IMPLANT
BLADE SAW SGTL 13.0X1.19X90.0M (BLADE) ×1 IMPLANT
BNDG ELASTIC 6INX 5YD STR LF (GAUZE/BANDAGES/DRESSINGS) ×1 IMPLANT
BOWL SMART MIX CTS (DISPOSABLE) ×1 IMPLANT
CEMENT HV SMART SET (Cement) ×2 IMPLANT
COMP FEM CMT ATTUNE 3 LT (Joint) ×1 IMPLANT
COMP FEM CMT ATTUNE LT 3 (Joint) ×1 IMPLANT
COMPONENT FEM CMT ATTUNE LT 3 (Joint) IMPLANT
COOLER ICEMAN CLASSIC (MISCELLANEOUS) IMPLANT
COVER SURGICAL LIGHT HANDLE (MISCELLANEOUS) ×1 IMPLANT
CUFF TOURN SGL QUICK 24 (TOURNIQUET CUFF) ×1
CUFF TOURN SGL QUICK 34 (TOURNIQUET CUFF)
CUFF TRNQT CYL 24X4X16.5-23 (TOURNIQUET CUFF) IMPLANT
CUFF TRNQT CYL 34X4.125X (TOURNIQUET CUFF) ×1 IMPLANT
DERMABOND ADVANCED .7 DNX12 (GAUZE/BANDAGES/DRESSINGS) ×1 IMPLANT
DRAPE U-SHAPE 47X51 STRL (DRAPES) ×1 IMPLANT
DRESSING AQUACEL AG SP 3.5X10 (GAUZE/BANDAGES/DRESSINGS) ×1 IMPLANT
DRSG AQUACEL AG ADV 3.5X10 (GAUZE/BANDAGES/DRESSINGS) IMPLANT
DRSG AQUACEL AG SP 3.5X10 (GAUZE/BANDAGES/DRESSINGS) ×1
DURAPREP 26ML APPLICATOR (WOUND CARE) ×2 IMPLANT
ELECT REM PT RETURN 15FT ADLT (MISCELLANEOUS) ×1 IMPLANT
GLOVE BIO SURGEON STRL SZ 6 (GLOVE) ×1 IMPLANT
GLOVE BIOGEL PI IND STRL 6.5 (GLOVE) ×1 IMPLANT
GLOVE BIOGEL PI IND STRL 7.5 (GLOVE) ×1 IMPLANT
GLOVE ORTHO TXT STRL SZ7.5 (GLOVE) ×2 IMPLANT
GOWN STRL REUS W/ TWL LRG LVL3 (GOWN DISPOSABLE) ×2 IMPLANT
GOWN STRL REUS W/TWL LRG LVL3 (GOWN DISPOSABLE) ×2
HANDPIECE INTERPULSE COAX TIP (DISPOSABLE) ×1
HOLDER FOLEY CATH W/STRAP (MISCELLANEOUS) IMPLANT
INSERT TIB ATT MED 3 8 LT (Insert) IMPLANT
KIT TURNOVER KIT A (KITS) IMPLANT
MANIFOLD NEPTUNE II (INSTRUMENTS) ×1 IMPLANT
NDL SAFETY ECLIPSE 18X1.5 (NEEDLE) IMPLANT
NDL SIDE PORT 18GA QUICK PRESS (NEEDLE) IMPLANT
NS IRRIG 1000ML POUR BTL (IV SOLUTION) ×1 IMPLANT
PACK TOTAL KNEE CUSTOM (KITS) ×1 IMPLANT
PIN FIX SIGMA LCS THRD HI (PIN) IMPLANT
PROTECTOR NERVE ULNAR (MISCELLANEOUS) ×1 IMPLANT
SET HNDPC FAN SPRY TIP SCT (DISPOSABLE) ×1 IMPLANT
SET PAD KNEE POSITIONER (MISCELLANEOUS) ×1 IMPLANT
SPIKE FLUID TRANSFER (MISCELLANEOUS) ×2 IMPLANT
SUT MNCRL AB 4-0 PS2 18 (SUTURE) ×1 IMPLANT
SUT STRATAFIX PDS+ 0 24IN (SUTURE) ×1 IMPLANT
SUT VIC AB 1 CT1 36 (SUTURE) ×1 IMPLANT
SUT VIC AB 2-0 CT1 TAPERPNT 27 (SUTURE) ×2 IMPLANT
SYR 3ML LL SCALE MARK (SYRINGE) ×1 IMPLANT
TOWEL GREEN STERILE FF (TOWEL DISPOSABLE) ×1 IMPLANT
TRAY FOLEY MTR SLVR 16FR STAT (SET/KITS/TRAYS/PACK) ×1 IMPLANT
TRAY FOLEY W/BAG SLVR 14FR LF (SET/KITS/TRAYS/PACK) IMPLANT
TUBE SUCTION HIGH CAP CLEAR NV (SUCTIONS) ×1 IMPLANT
WATER STERILE IRR 1000ML POUR (IV SOLUTION) ×2 IMPLANT
WRAP KNEE MAXI GEL POST OP (GAUZE/BANDAGES/DRESSINGS) ×1 IMPLANT

## 2022-12-21 NOTE — Transfer of Care (Signed)
Immediate Anesthesia Transfer of Care Note  Patient: Belinda Frazier  Procedure(s) Performed: TOTAL KNEE ARTHROPLASTY (Left: Knee)  Patient Location: PACU  Anesthesia Type:Spinal  Level of Consciousness: awake and alert   Airway & Oxygen Therapy: Patient Spontanous Breathing and Patient connected to face mask oxygen  Post-op Assessment: Report given to RN and Post -op Vital signs reviewed and stable  Post vital signs: Reviewed and stable  Last Vitals:  Vitals Value Taken Time  BP 113/71 12/21/22 0853  Temp    Pulse 84 12/21/22 0856  Resp 11 12/21/22 0856  SpO2 100 % 12/21/22 0856  Vitals shown include unfiled device data.  Last Pain:  Vitals:   12/21/22 0606  TempSrc: Oral  PainSc:          Complications: No notable events documented.

## 2022-12-21 NOTE — Evaluation (Addendum)
Physical Therapy Evaluation Patient Details Name: Belinda Frazier MRN: [id number] DOB: 20-Aug-1955 Today's Date: 12/21/2022  History of Present Illness  67 y.o. female admitted 12/21/22 for L TKA. PMH: CKD, DM, breast cancer, s/p mastectomy.  Clinical Impression  Pt is s/p TKA resulting in the deficits listed below (see PT Problem List). Pt ambulated 40' with RW, no loss of balance. Initiated TKA HEP. Good progress expected.  Pt will benefit from acute skilled PT to increase their independence and safety with mobility to allow discharge.          If plan is discharge home, recommend the following: A little help with walking and/or transfers;A little help with bathing/dressing/bathroom;Assistance with cooking/housework;Assist for transportation;Help with stairs or ramp for entrance   Can travel by private vehicle        Equipment Recommendations Rolling walker (2 wheels) (youth RW)  Recommendations for Other Services       Functional Status Assessment Patient has had a recent decline in their functional status and demonstrates the ability to make significant improvements in function in a reasonable and predictable amount of time.     Precautions / Restrictions   Fall, knee (reviewed no pillow under knee) WBAT LLE     Mobility  Bed Mobility Overal bed mobility: Modified Independent             General bed mobility comments: HOB up, used rail    Transfers Overall transfer level: Needs assistance Equipment used: Rolling walker (2 wheels) Transfers: Sit to/from Stand Sit to Stand: Contact guard assist           General transfer comment: VCs hand placement    Ambulation/Gait Ambulation/Gait assistance: Contact guard assist Gait Distance (Feet): 40 Feet Assistive device: Rolling walker (2 wheels) Gait Pattern/deviations: Step-to pattern, Decreased step length - left, Decreased step length - right Gait velocity: decr     General Gait Details: VCs sequencing, no loss  of balance  Stairs            Wheelchair Mobility     Tilt Bed    Modified Rankin (Stroke Patients Only)       Balance Overall balance assessment: Modified Independent                                           Pertinent Vitals/Pain Pain Assessment Pain Assessment: 0-10 Pain Score: 6  Pain Location: L knee Pain Descriptors / Indicators: Sore Pain Intervention(s): Limited activity within patient's tolerance, Monitored during session, Premedicated before session, Ice applied    Home Living Family/patient expects to be discharged to:: Private residence Living Arrangements: Spouse/significant other Available Help at Discharge: Family;Available 24 hours/day   Home Access: Ramped entrance       Home Layout: One level Home Equipment: Rollator (4 wheels)      Prior Function Prior Level of Function : Independent/Modified Independent             Mobility Comments: denies falls in past 6 months, walked without AD ADLs Comments: independent     Extremity/Trunk Assessment   Upper Extremity Assessment Upper Extremity Assessment: Overall WFL for tasks assessed    Lower Extremity Assessment Lower Extremity Assessment: LLE deficits/detail LLE Deficits / Details: 3/5 SLR, L knee AAROM ~5-45* LLE Sensation: WNL LLE Coordination: WNL    Cervical / Trunk Assessment Cervical / Trunk Assessment: Normal  Communication   Communication Communication:  No apparent difficulties  Cognition Arousal: Alert Behavior During Therapy: WFL for tasks assessed/performed                                            General Comments      Exercises Total Joint Exercises Ankle Circles/Pumps: AROM, Both, 10 reps, Supine Hip ABduction/ADduction: AAROM, Left, Supine, 5 reps Goniometric ROM: 5-45* AAROM L knee   Assessment/Plan    PT Assessment Patient needs continued PT services  PT Problem List Decreased range of motion;Decreased  strength;Decreased mobility;Decreased balance;Decreased activity tolerance;Decreased knowledge of use of DME       PT Treatment Interventions Therapeutic exercise;Gait training;Therapeutic activities;Patient/family education    PT Goals (Current goals can be found in the Care Plan section)  Acute Rehab PT Goals Patient Stated Goal: travel PT Goal Formulation: With patient/family Time For Goal Achievement: 12/28/22 Potential to Achieve Goals: Good    Frequency 7X/week     Co-evaluation               AM-PAC PT "6 Clicks" Mobility  Outcome Measure Help needed turning from your back to your side while in a flat bed without using bedrails?: None Help needed moving from lying on your back to sitting on the side of a flat bed without using bedrails?: A Little Help needed moving to and from a bed to a chair (including a wheelchair)?: A Little Help needed standing up from a chair using your arms (e.g., wheelchair or bedside chair)?: A Little Help needed to walk in hospital room?: A Little Help needed climbing 3-5 steps with a railing? : A Lot 6 Click Score: 18    End of Session Equipment Utilized During Treatment: Gait belt Activity Tolerance: Patient tolerated treatment well Patient left: in chair;with chair alarm set;with call bell/phone within reach;with family/visitor present Nurse Communication: Mobility status PT Visit Diagnosis: Difficulty in walking, not elsewhere classified (R26.2);Pain;Muscle weakness (generalized) (M62.81) Pain - Right/Left: Left Pain - part of body: Knee    Time: 3474-2595 PT Time Calculation (min) (ACUTE ONLY): 23 min   Charges:   PT Evaluation $PT Eval Moderate Complexity: 1 Mod PT Treatments $Gait Training: 8-22 mins PT General Charges $$ ACUTE PT VISIT: 1 Visit         Tamala Ser PT 12/21/2022  Acute Rehabilitation Services  Office [phone]

## 2022-12-21 NOTE — Discharge Instructions (Signed)

## 2022-12-21 NOTE — H&P (Signed)
TOTAL KNEE ADMISSION H&P  Patient is being admitted for left total knee arthroplasty.  Therapy Plans: outpatient therapy at [facility] in [place] Disposition: Home with husband Planned DVT Prophylaxis: aspirin 81mg  BID DME needed: walker, CTU PCP: Dr. Nemiah Commander - appointment on Tuesday TXA: IV Allergies: sulfa - hives Anesthesia Concerns: none BMI: 26 Last HgbA1c: 6.6%   Other: - Patient is a case manager - oxycodone, robaxin, tylenol, meloxicam - No hx of VTE - Hx of breast cancer in 1996 - s/p bilateral mastectomy   Subjective:  Chief Complaint:left knee pain.  HPI: Belinda Frazier, 67 y.o. female, has a history of pain and functional disability in the left knee due to arthritis and has failed non-surgical conservative treatments for greater than 12 weeks to includeNSAID's and/or analgesics, corticosteriod injections, and activity modification.  Onset of symptoms was gradual, starting 2 years ago with gradually worsening course since that time. The patient noted no past surgery on the left knee(s).  Patient currently rates pain in the left knee(s) at 8 out of 10 with activity. Patient has worsening of pain with activity and weight bearing, pain that interferes with activities of daily living, and pain with passive range of motion.  Patient has evidence of joint space narrowing by imaging studies. There is no active infection.  There are no problems to display for this patient.  Past Medical History:  Diagnosis Date   Anemia    Arthritis    Breast cancer (HCC) left breast   bilateral mastectomy   Chronic kidney disease    stage III   Diabetes mellitus without complication [facility])     Past Surgical History:  Procedure Laterality Date   CESAREAN SECTION  1990   x2 [id number]   CHOLECYSTECTOMY     2019   MASTECTOMY, RADICAL Bilateral 1996    Current Facility-Administered Medications  Medication Dose Route Frequency Provider Last Rate Last Admin   ceFAZolin (ANCEF) IVPB 2g/100 mL  premix  2 g Intravenous On Call to OR Cassandria Anger, PA-C       dexamethasone (DECADRON) injection 8 mg  8 mg Intravenous Once Cassandria Anger, PA-C       lactated ringers infusion   Intravenous Continuous Cassandria Anger, PA-C       lactated ringers infusion   Intravenous Continuous Beryle Lathe, MD 10 mL/hr at 12/21/22 0628 New Bag at 12/21/22 0865   phenylephrine (NEOSYNEPHRINE) 20-0.9 MG/250ML-% infusion            povidone-iodine 10 % swab 2 Application  2 Application Topical Once Cassandria Anger, PA-C       tranexamic acid (CYKLOKAPRON) IVPB 1,000 mg  1,000 mg Intravenous To OR Cassandria Anger, PA-C       Allergies  Allergen Reactions   Sulfa Antibiotics Rash    Social History   Tobacco Use   Smoking status: Never   Smokeless tobacco: Never  Substance Use Topics   Alcohol use: Not Currently    History reviewed. No pertinent family history.   Review of Systems  Constitutional:  Negative for chills and fever.  Respiratory:  Negative for cough and shortness of breath.   Cardiovascular:  Negative for chest pain.  Gastrointestinal:  Negative for nausea and vomiting.  Musculoskeletal:  Positive for arthralgias.     Objective:  Physical Exam Well nourished and well developed. General: Alert and oriented x3, cooperative and pleasant, no acute distress. Head: normocephalic, atraumatic, neck supple. Eyes: EOMI.  Musculoskeletal: Left knee  exam: No palpable effusion, warmth erythema Tenderness around the anterior lateral aspect of the knee at the patellar region and laterally over the lateral retinacular structures Mild joint line tenderness medially and laterally Stable medial and lateral collateral ligaments No significant flexion contracture with flexion to 120 degrees with tightness and mild crepitation anteriorly and without patella subluxation related issues Bilateral hip exam reveals fluid range of motion of both hips without reproducible groin pain She  does have some tenderness over the lateral greater than right hip  Calves soft and nontender. Motor function intact in LE. Strength 5/5 LE bilaterally. Neuro: Distal pulses 2+. Sensation to light touch intact in LE.  Vital signs in last 24 hours: Temp:  [98 F (36.7 C)] 98 F (36.7 C) (11/19 0606) Pulse Rate:  [71] 71 (11/19 0606) Resp:  [13] 13 (11/19 0606) BP: (149)/(66) 149/66 (11/19 0606) SpO2:  [97 %] 97 % (11/19 0606) Weight:  [57.2 kg] 57.2 kg (11/19 0537)  Labs:   Estimated body mass index is 25.45 kg/m as calculated from the following:   Height as of this encounter: 4\' 11"  (1.499 m).   Weight as of this encounter: 57.2 kg.   Imaging Review Plain radiographs demonstrate severe degenerative joint disease of the left knee(s). The overall alignment isneutral. The bone quality appears to be adequate for age and reported activity level.      Assessment/Plan:  End stage arthritis, left knee   The patient history, physical examination, clinical judgment of the provider and imaging studies are consistent with end stage degenerative joint disease of the left knee(s) and total knee arthroplasty is deemed medically necessary. The treatment options including medical management, injection therapy arthroscopy and arthroplasty were discussed at length. The risks and benefits of total knee arthroplasty were presented and reviewed. The risks due to aseptic loosening, infection, stiffness, patella tracking problems, thromboembolic complications and other imponderables were discussed. The patient acknowledged the explanation, agreed to proceed with the plan and consent was signed. Patient is being admitted for inpatient treatment for surgery, pain control, PT, OT, prophylactic antibiotics, VTE prophylaxis, progressive ambulation and ADL's and discharge planning. The patient is planning to be discharged  home.     Patient's anticipated LOS is less than 2 midnights, meeting these  requirements: - Younger than 71 - Lives within 1 hour of care - Has a competent adult at home to recover with post-op recover - NO history of  - Chronic pain requiring opiods  - Diabetes  - Coronary Artery Disease  - Heart failure  - Heart attack  - Stroke  - DVT/VTE  - Cardiac arrhythmia  - Respiratory Failure/COPD  - Renal failure  - Anemia  - Advanced Liver disease  Rosalene Billings, PA-C Orthopedic Surgery [facility] [place] [phone]

## 2022-12-21 NOTE — Interval H&P Note (Signed)
History and Physical Interval Note:  12/21/2022 7:04 AM  Belinda Frazier  has presented today for surgery, with the diagnosis of Left knee osteoarthritis.  The various methods of treatment have been discussed with the patient and family. After consideration of risks, benefits and other options for treatment, the patient has consented to  Procedure(s): TOTAL KNEE ARTHROPLASTY (Left) as a surgical intervention.  The patient's history has been reviewed, patient examined, no change in status, stable for surgery.  I have reviewed the patient's chart and labs.  Questions were answered to the patient's satisfaction.     Shelda Pal

## 2022-12-21 NOTE — Anesthesia Procedure Notes (Signed)
Anesthesia Regional Block: Adductor canal block   Pre-Anesthetic Checklist: , timeout performed,  Correct Patient, Correct Site, Correct Laterality,  Correct Procedure, Correct Position, site marked,  Risks and benefits discussed,  Surgical consent,  Pre-op evaluation,  At surgeon's request and post-op pain management  Laterality: Left  Prep: chloraprep       Needles:  Injection technique: Single-shot  Needle Type: Echogenic Needle     Needle Length: 9cm  Needle Gauge: 21     Additional Needles:   Procedures:,,,, ultrasound used (permanent image in chart),,    Narrative:  Start time: 12/21/2022 6:50 AM End time: 12/21/2022 7:00 AM Injection made incrementally with aspirations every 5 mL.  Performed by: Personally  Anesthesiologist: Collene Schlichter, MD  Additional Notes: No pain on injection. No increased resistance to injection. Injection made in 5cc increments.  Good needle visualization.  Patient tolerated procedure well.

## 2022-12-21 NOTE — Anesthesia Postprocedure Evaluation (Signed)
Anesthesia Post Note  Patient: Gisell Nuernberger  Procedure(s) Performed: TOTAL KNEE ARTHROPLASTY (Left: Knee)     Patient location during evaluation: PACU Anesthesia Type: Spinal Level of consciousness: awake, awake and alert and oriented Pain management: pain level controlled Vital Signs Assessment: post-procedure vital signs reviewed and stable Respiratory status: spontaneous breathing, nonlabored ventilation and respiratory function stable Cardiovascular status: blood pressure returned to baseline and stable Postop Assessment: no headache, no backache, spinal receding and no apparent nausea or vomiting Anesthetic complications: no   No notable events documented.  Last Vitals:  Vitals:   12/21/22 1100 12/21/22 1145  BP: (!) 119/57 124/60  Pulse: 80 78  Resp: 13 17  Temp:  36.4 C  SpO2: 100% 99%    Last Pain:  Vitals:   12/21/22 1145  TempSrc: Oral  PainSc:                  Collene Schlichter

## 2022-12-21 NOTE — Plan of Care (Signed)

## 2022-12-21 NOTE — Anesthesia Procedure Notes (Signed)
Spinal  Patient location during procedure: OR Start time: 12/21/2022 7:16 AM End time: 12/21/2022 7:19 AM Reason for block: surgical anesthesia Staffing Performed: anesthesiologist  Anesthesiologist: Collene Schlichter, MD Performed by: Collene Schlichter, MD Authorized by: Collene Schlichter, MD   Preanesthetic Checklist Completed: patient identified, IV checked, risks and benefits discussed, surgical consent, monitors and equipment checked, pre-op evaluation and timeout performed Spinal Block Patient position: sitting Prep: DuraPrep and site prepped and draped Patient monitoring: continuous pulse ox and blood pressure Approach: midline Location: L3-4 Injection technique: single-shot Needle Needle type: Pencan  Needle gauge: 24 G Assessment Events: CSF return Additional Notes Functioning IV was confirmed and monitors were applied. Sterile prep and drape, including hand hygiene, mask and sterile gloves were used. The patient was positioned and the spine was prepped. The skin was anesthetized with lidocaine.  Free flow of clear CSF was obtained prior to injecting local anesthetic into the CSF.  The spinal needle aspirated freely following injection.  The needle was carefully withdrawn.  The patient tolerated the procedure well. Consent was obtained prior to procedure with all questions answered and concerns addressed. Risks including but not limited to bleeding, infection, nerve damage, paralysis, failed block, inadequate analgesia, allergic reaction, high spinal, itching and headache were discussed and the patient wished to proceed.   Arrie Aran, MD

## 2022-12-21 NOTE — Plan of Care (Signed)
  Problem: Coping: Goal: Level of anxiety will decrease Outcome: Progressing   Problem: Pain Management: Goal: General experience of comfort will improve Outcome: Progressing   Problem: Safety: Goal: Ability to remain free from injury will improve Outcome: Progressing

## 2022-12-21 NOTE — Op Note (Signed)
NAME:  Belinda Frazier                      MEDICAL RECORD NO.:  [id number]                             FACILITY:  [facility]      PHYSICIAN:  Madlyn Frankel. Charlann Boxer, M.D.  DATE OF BIRTH:  1955/06/26      DATE OF PROCEDURE:  12/21/2022                                     OPERATIVE REPORT         PREOPERATIVE DIAGNOSIS:  Left knee osteoarthritis.      POSTOPERATIVE DIAGNOSIS:  Left knee osteoarthritis.      FINDINGS:  The patient was noted to have complete loss of cartilage and   bone-on-bone arthritis with associated osteophytes in the patellofemoral and lateral compartments of   the knee with a significant synovitis and associated effusion.  The patient had failed months of conservative treatment including medications, injection therapy, activity modification.     PROCEDURE:  Left total knee replacement.      COMPONENTS USED:  DePuy Attune FB CR MS knee   system, a size 3N femur, 2 tibia, size 8 mm CR MS AOX insert, and 32 anatomic patellar   button.      SURGEON:  Madlyn Frankel. Charlann Boxer, M.D.      ASSISTANT:  Rosalene Billings, PA-C.      ANESTHESIA:  Regional and Spinal.      SPECIMENS:  None.      COMPLICATION:  None.      DRAINS:  None.  EBL: <100 cc      TOURNIQUET TIME:   Total Tourniquet Time Documented: Thigh (Right) - 30 minutes Total: Thigh (Right) - 30 minutes  .      The patient was stable to the recovery room.      INDICATION FOR PROCEDURE:  Belinda Frazier is a 68 y.o. female patient of   mine.  The patient had been seen, evaluated, and treated for months conservatively in the   office with medication, activity modification, and injections.  The patient had   radiographic changes of bone-on-bone arthritis with endplate sclerosis and osteophytes noted.  Based on the radiographic changes and failed conservative measures, the patient   decided to proceed with definitive treatment, total knee replacement.  Risks of infection, DVT, component failure, need for revision surgery,  neurovascular injury were reviewed in the office setting.  The postop course was reviewed stressing the efforts to maximize post-operative satisfaction and function.  Consent was obtained for benefit of pain   relief.      PROCEDURE IN DETAIL:  The patient was brought to the operative theater.   Once adequate anesthesia, preoperative antibiotics, 2 gm of Ancef,1 gm of Tranexamic Acid, and 10 mg of Decadron administered, the patient was positioned supine with a left thigh tourniquet placed.  The  left lower extremity was prepped and draped in sterile fashion.  A time-   out was performed identifying the patient, planned procedure, and the appropriate extremity.      The left lower extremity was placed in the [facility] leg holder.  The leg was   exsanguinated, tourniquet elevated to 225 mmHg.  A midline incision was   made followed  by median parapatellar arthrotomy.  Following initial   exposure, attention was first directed to the patella.  Precut   measurement was noted to be 20 mm.  I resected down to 13 mm and used a   32 anatomic patellar button to restore patellar height as well as cover the cut surface.      The lug holes were drilled and a metal shim was placed to protect the   patella from retractors and saw blade during the procedure.      At this point, attention was now directed to the femur.  The femoral   canal was opened with a drill, irrigated to try to prevent fat emboli.  An   intramedullary rod was passed at 3 degrees valgus, 11 mm of bone was   resected off the distal femur.  Following this resection, the tibia was   subluxated anteriorly.  Using the extramedullary guide, 4 mm of bone was resected off   the proximal medial tibia.  We confirmed the gap would be   stable medially and laterally with a size 6 spacer block as well as confirmed that the tibial cut was perpendicular in the coronal plane, checking with an alignment rod.      Once this was done, I sized the femur to  be a size 3 in the anterior-   posterior dimension, chose a narrow component based on medial and   lateral dimension.  The size 3 rotation block was then pinned in   position anterior referenced using the C-clamp to set rotation.  The   anterior, posterior, and  chamfer cuts were made without difficulty nor   notching making certain that I was along the anterior cortex to help   with flexion gap stability.      The final box cut was made off the lateral aspect of distal femur.      At this point, the tibia was sized to be a size 2.  The size 2 tray was   then pinned in position through the medial third of the tubercle,   drilled, and keel punched.  Trial reduction was now carried with a 3 femur,  2 tibia, a size 8 mm CR MS insert, and the 32 anatomic patella botton.  The knee was brought to full extension with good flexion stability with the patella   tracking through the trochlea without application of pressure.  Given   all these findings the trial components removed.  Final components were   opened and cement was mixed.  The knee was irrigated with normal saline solution and pulse lavage.  The synovial lining was   then injected with 30 cc of 0.25% Marcaine with epinephrine, 1 cc of Toradol and 30 cc of NS for a total of 61 cc.     Final implants were then cemented onto cleaned and dried cut surfaces of bone with the knee brought to extension with a size 8 mm CR MS trial insert.      Once the cement had fully cured, excess cement was removed   throughout the knee.  I confirmed that I was satisfied with the range of   motion and stability, and the final size 8 mm CR MS AOX insert was chosen.  It was   placed into the knee.      The tourniquet had been let down at 30 minutes.  No significant   hemostasis was required.  The extensor mechanism was then reapproximated using #1  Vicryl and #1 Stratafix sutures with the knee   in flexion.  The   remaining wound was closed with 2-0 Vicryl and  running 4-0 Monocryl.   The knee was cleaned, dried, dressed sterilely using Dermabond and   Aquacel dressing.  The patient was then   brought to recovery room in stable condition, tolerating the procedure   well.   Please note that Physician Assistant, Rosalene Billings, PA-C was present for the entirety of the case, and was utilized for pre-operative positioning, peri-operative retractor management, general facilitation of the procedure and for primary wound closure at the end of the case.              Madlyn Frankel Charlann Boxer, M.D.    12/21/2022 8:31 AM

## 2022-12-22 ENCOUNTER — Encounter (HOSPITAL_COMMUNITY): Payer: Self-pay | Admitting: Orthopedic Surgery

## 2022-12-22 DIAGNOSIS — M1712 Unilateral primary osteoarthritis, left knee: Secondary | ICD-10-CM | POA: Diagnosis not present

## 2022-12-22 LAB — CBC
HCT: 24.7 % — ABNORMAL LOW (ref 36.0–46.0)
Hemoglobin: 7.9 g/dL — ABNORMAL LOW (ref 12.0–15.0)
MCH: 30.4 pg (ref 26.0–34.0)
MCHC: 32 g/dL (ref 30.0–36.0)
MCV: 95 fL (ref 80.0–100.0)
Platelets: 215 10*3/uL (ref 150–400)
RBC: 2.6 MIL/uL — ABNORMAL LOW (ref 3.87–5.11)
RDW: 14.1 % (ref 11.5–15.5)
WBC: 9.5 10*3/uL (ref 4.0–10.5)
nRBC: 0 % (ref 0.0–0.2)

## 2022-12-22 LAB — BASIC METABOLIC PANEL
Anion gap: 8 (ref 5–15)
BUN: 35 mg/dL — ABNORMAL HIGH (ref 8–23)
CO2: 19 mmol/L — ABNORMAL LOW (ref 22–32)
Calcium: 8.4 mg/dL — ABNORMAL LOW (ref 8.9–10.3)
Chloride: 109 mmol/L (ref 98–111)
Creatinine, Ser: 1.61 mg/dL — ABNORMAL HIGH (ref 0.44–1.00)
GFR, Estimated: 34 mL/min — ABNORMAL LOW (ref >60)
Glucose, Bld: 139 mg/dL — ABNORMAL HIGH (ref 70–99)
Potassium: 3.9 mmol/L (ref 3.5–5.1)
Sodium: 136 mmol/L (ref 135–145)

## 2022-12-22 LAB — GLUCOSE, CAPILLARY: Glucose-Capillary: 80 mg/dL (ref 70–99)

## 2022-12-22 MED ORDER — OXYCODONE HCL 5 MG PO TABS: 5.0000 mg | ORAL_TABLET | ORAL | 0 refills | Status: AC | PRN

## 2022-12-22 MED ORDER — FERROUS SULFATE 325 (65 FE) MG PO TABS: 325.0000 mg | ORAL_TABLET | Freq: Two times a day (BID) | ORAL | Status: AC

## 2022-12-22 MED ORDER — POLYETHYLENE GLYCOL 3350 17 G PO PACK: 17.0000 g | PACK | Freq: Two times a day (BID) | ORAL | 0 refills | Status: AC

## 2022-12-22 MED ORDER — METHOCARBAMOL 500 MG PO TABS: 500.0000 mg | ORAL_TABLET | Freq: Four times a day (QID) | ORAL | 2 refills | Status: AC | PRN

## 2022-12-22 MED ORDER — SENNA 8.6 MG PO TABS: 2.0000 | ORAL_TABLET | Freq: Every day | ORAL | 0 refills | Status: AC

## 2022-12-22 MED ORDER — ASPIRIN 81 MG PO CHEW: 81.0000 mg | CHEWABLE_TABLET | Freq: Two times a day (BID) | ORAL | 0 refills | Status: AC

## 2022-12-22 NOTE — TOC Transition Note (Signed)
Transition of Care [facility]) - CM/SW Discharge Note   Patient Details  Name: Belinda Frazier MRN: [id number] Date of Birth: 1955/11/17  Transition of [facility]) CM/SW Contact:  Amada Jupiter, LCSW Phone Number: 12/22/2022, 11:24 AM   Clinical Narrative:     Met with pt and confirming she has received youth RW to room via [employer] (ordered by ortho MD office prior to surgery). OPPT already arranged with [facility] ([place]).  No further TOC needs.  Final next level of care: OP Rehab Barriers to Discharge: No Barriers Identified   Patient Goals and CMS Choice      Discharge Placement                         Discharge Plan and Services Additional resources added to the After Visit Summary for                  DME Arranged: Walker rolling DME Agency: [employer]                  Social Determinants of Health (SDOH) Interventions SDOH Screenings   Food Insecurity: No Food Insecurity (12/21/2022)  Housing: Low Risk  (12/21/2022)  Transportation Needs: No Transportation Needs (12/21/2022)  Utilities: Not At Risk (12/21/2022)  Financial Resource Strain: Low Risk  (12/07/2022)   Received from [facility]  Tobacco Use: Low Risk  (12/21/2022)  Recent Concern: Tobacco Use - Medium Risk (12/07/2022)   Received from [facility]     Readmission Risk Interventions     No data to display

## 2022-12-22 NOTE — Plan of Care (Signed)

## 2022-12-22 NOTE — Progress Notes (Signed)
Provided discharge education/instructions, all questions answered. Pt is not in any distress, discharged home with belongings accompanied by her husband.

## 2022-12-22 NOTE — Progress Notes (Signed)
   Subjective: 1 Day Post-Op Procedure(s) (LRB): TOTAL KNEE ARTHROPLASTY (Left) Patient reports pain as mild.   Patient seen in rounds with Dr. Charlann Boxer. Patient is well, and has had no acute complaints or problems. No acute events overnight. Foley catheter removed. Patient ambulated 40 feet with PT.  We will start therapy today.   Objective: Vital signs in last 24 hours: Temp:  [97 F (36.1 C)-98 F (36.7 C)] 97.7 F (36.5 C) (11/20 0559) Pulse Rate:  [62-84] 62 (11/20 0559) Resp:  [11-17] 17 (11/20 0559) BP: (102-124)/(44-71) 115/52 (11/20 0559) SpO2:  [99 %-100 %] 100 % (11/20 0559)  Intake/Output from previous day:  Intake/Output Summary (Last 24 hours) at 12/22/2022 0800 Last data filed at 12/22/2022 0981 Gross per 24 hour  Intake 2190.69 ml  Output 1225 ml  Net 965.69 ml     Intake/Output this shift: No intake/output data recorded.  Labs: Recent Labs    12/22/22 0353  HGB 7.9*   Recent Labs    12/22/22 0353  WBC 9.5  RBC 2.60*  HCT 24.7*  PLT 215   Recent Labs    12/22/22 0353  NA 136  K 3.9  CL 109  CO2 19*  BUN 35*  CREATININE 1.61*  GLUCOSE 139*  CALCIUM 8.4*   No results for input(s): "LABPT", "INR" in the last 72 hours.  Exam: General - Patient is Alert and Oriented Extremity - Neurologically intact Sensation intact distally Intact pulses distally Dorsiflexion/Plantar flexion intact Dressing - dressing C/D/I Motor Function - intact, moving foot and toes well on exam.   Past Medical History:  Diagnosis Date   Anemia    Arthritis    Breast cancer (HCC) left breast   bilateral mastectomy   Chronic kidney disease    stage III   Diabetes mellitus without complication (HCC)     Assessment/Plan: 1 Day Post-Op Procedure(s) (LRB): TOTAL KNEE ARTHROPLASTY (Left) Principal Problem:   S/P total knee arthroplasty, left  Estimated body mass index is 25.45 kg/m as calculated from the following:   Height as of this encounter: 4\' 11"   (1.499 m).   Weight as of this encounter: 57.2 kg. Advance diet Up with therapy D/C IV fluids   Patient's anticipated LOS is less than 2 midnights, meeting these requirements: - Younger than 38 - Lives within 1 hour of care - Has a competent adult at home to recover with post-op recover - NO history of  - Chronic pain requiring opiods  - Diabetes  - Coronary Artery Disease  - Heart failure  - Heart attack  - Stroke  - DVT/VTE  - Cardiac arrhythmia  - Respiratory Failure/COPD  - Renal failure  - Anemia  - Advanced Liver disease     DVT Prophylaxis - Aspirin Weight bearing as tolerated.  Hgb stable at 7.9 this AM. Will send home with iron to take if tolerated.  Cr 1.6, baseline ~1.3 - likely related to volume, encourage oral fluids   Plan is to go Home after hospital stay. Plan for discharge today following 1-2 sessions of PT as long as they are meeting their goals. Patient is scheduled for OPPT. Follow up in the office in 2 weeks.   Rosalene Billings, PA-C Orthopedic Surgery [phone] 12/22/2022, 8:00 AM

## 2022-12-22 NOTE — Progress Notes (Signed)
Physical Therapy Treatment Patient Details Name: Belinda Frazier MRN: [id number] DOB: 22-Jul-1955 Today's Date: 12/22/2022   History of Present Illness 67 y.o. female admitted 12/21/22 for L TKA. PMH: CKD, DM, breast cancer, s/p mastectomy.    PT Comments  Pt is progressing well with mobility, she ambulated 130' with RW, no loss of balance. She demonstrates good understanding of HEP. She is ready to DC home from a PT standpoint.     If plan is discharge home, recommend the following: A little help with bathing/dressing/bathroom;Assistance with cooking/housework;Assist for transportation;Help with stairs or ramp for entrance   Can travel by private vehicle        Equipment Recommendations  Rolling walker (2 wheels) (youth RW)    Recommendations for Other Services       Precautions / Restrictions Precautions Precautions: Knee;Fall Precaution Booklet Issued: Yes (comment) Precaution Comments: reviewed no pillow under knee Restrictions Weight Bearing Restrictions: No LLE Weight Bearing: Weight bearing as tolerated Other Position/Activity Restrictions: WBAT LLE     Mobility  Bed Mobility               General bed mobility comments: up in bathroom    Transfers Overall transfer level: Needs assistance Equipment used: Rolling walker (2 wheels) Transfers: Sit to/from Stand Sit to Stand: Supervision           General transfer comment: VCs hand placement    Ambulation/Gait Ambulation/Gait assistance: Supervision Gait Distance (Feet): 130 Feet Assistive device: Rolling walker (2 wheels) Gait Pattern/deviations: Step-to pattern, Decreased step length - left, Decreased step length - right Gait velocity: decr     General Gait Details: VCs sequencing, no loss of balance   Stairs             Wheelchair Mobility     Tilt Bed    Modified Rankin (Stroke Patients Only)       Balance Overall balance assessment: Modified Independent                                           Cognition Arousal: Alert Behavior During Therapy: WFL for tasks assessed/performed Overall Cognitive Status: Within Functional Limits for tasks assessed                                          Exercises Total Joint Exercises Ankle Circles/Pumps: AROM, Both, 10 reps, Supine Quad Sets: AROM, Both, 5 reps, Supine Short Arc Quad: AROM, Left, 5 reps, Supine Heel Slides: AAROM, Left, 5 reps, Supine Hip ABduction/ADduction: AAROM, Left, Supine, 5 reps Straight Leg Raises: AROM, Left, 5 reps, Supine Long Arc Quad: AROM, Left, 5 reps, Seated Knee Flexion: AAROM, Left, 5 reps, Seated Goniometric ROM: 5-85* AAROM L knee    General Comments        Pertinent Vitals/Pain Pain Assessment Pain Score: 3  Pain Location: L knee with walking Pain Descriptors / Indicators: Sore Pain Intervention(s): Limited activity within patient's tolerance, Monitored during session, Premedicated before session, Ice applied    Home Living                          Prior Function            PT Goals (current goals can now be found in the  care plan section) Acute Rehab PT Goals Patient Stated Goal: travel, go on cruise next year PT Goal Formulation: With patient/family Time For Goal Achievement: 12/28/22 Potential to Achieve Goals: Good Progress towards PT goals: Progressing toward goals    Frequency    7X/week      PT Plan      Co-evaluation              AM-PAC PT "6 Clicks" Mobility   Outcome Measure  Help needed turning from your back to your side while in a flat bed without using bedrails?: None Help needed moving from lying on your back to sitting on the side of a flat bed without using bedrails?: A Little Help needed moving to and from a bed to a chair (including a wheelchair)?: None Help needed standing up from a chair using your arms (e.g., wheelchair or bedside chair)?: None Help needed to walk in hospital room?:  None Help needed climbing 3-5 steps with a railing? : A Little 6 Click Score: 22    End of Session Equipment Utilized During Treatment: Gait belt Activity Tolerance: Patient tolerated treatment well Patient left: in chair;with chair alarm set;with call bell/phone within reach;with family/visitor present Nurse Communication: Mobility status PT Visit Diagnosis: Difficulty in walking, not elsewhere classified (R26.2);Pain;Muscle weakness (generalized) (M62.81) Pain - Right/Left: Left Pain - part of body: Knee     Time: 9528-4132 PT Time Calculation (min) (ACUTE ONLY): 20 min  Charges:    $Gait Training: 8-22 mins PT General Charges $$ ACUTE PT VISIT: 1 Visit                     Tamala Ser PT 12/22/2022  Acute Rehabilitation Services  Office [phone]

## 2022-12-22 NOTE — Care Management Obs Status (Signed)
MEDICARE OBSERVATION STATUS NOTIFICATION   Patient Details  Name: Belinda Frazier MRN: [id number] Date of Birth: 1955-03-07   Medicare Observation Status Notification Given:  Hart Robinsons, LCSW 12/22/2022, 11:43 AM

## 2022-12-24 ENCOUNTER — Ambulatory Visit: Payer: Medicare HMO | Attending: Student | Admitting: Physical Therapy

## 2022-12-24 DIAGNOSIS — M25662 Stiffness of left knee, not elsewhere classified: Secondary | ICD-10-CM | POA: Insufficient documentation

## 2022-12-24 DIAGNOSIS — M6281 Muscle weakness (generalized): Secondary | ICD-10-CM | POA: Diagnosis present

## 2022-12-24 DIAGNOSIS — Z96652 Presence of left artificial knee joint: Secondary | ICD-10-CM | POA: Diagnosis present

## 2022-12-24 DIAGNOSIS — M25562 Pain in left knee: Secondary | ICD-10-CM | POA: Insufficient documentation

## 2022-12-24 DIAGNOSIS — R269 Unspecified abnormalities of gait and mobility: Secondary | ICD-10-CM | POA: Insufficient documentation

## 2022-12-27 ENCOUNTER — Ambulatory Visit: Payer: Medicare HMO | Admitting: Physical Therapy

## 2022-12-27 DIAGNOSIS — R269 Unspecified abnormalities of gait and mobility: Secondary | ICD-10-CM

## 2022-12-27 DIAGNOSIS — M25662 Stiffness of left knee, not elsewhere classified: Secondary | ICD-10-CM

## 2022-12-27 DIAGNOSIS — M25562 Pain in left knee: Secondary | ICD-10-CM

## 2022-12-27 DIAGNOSIS — Z96652 Presence of left artificial knee joint: Secondary | ICD-10-CM

## 2022-12-27 DIAGNOSIS — M6281 Muscle weakness (generalized): Secondary | ICD-10-CM

## 2022-12-28 ENCOUNTER — Ambulatory Visit: Payer: Medicare HMO | Admitting: Physical Therapy

## 2022-12-28 NOTE — Therapy (Signed)
OUTPATIENT PHYSICAL THERAPY LOWER EXTREMITY TREATMENT   Patient Name: Belinda Frazier MRN: [id number] DOB:01-03-1956, 67 y.o., female Today's Date: 12/28/2022   END OF SESSION:  PT End of Session - 12/28/22 1828     Visit Number 2    Number of Visits 12    Date for PT Re-Evaluation 02/04/23    PT Start Time 1236    PT Stop Time 1319    PT Time Calculation (min) 43 min             Past Medical History:  Diagnosis Date   Anemia    Arthritis    Breast cancer (HCC) left breast   bilateral mastectomy   Chronic kidney disease    stage III   Diabetes mellitus without complication [facility])    Past Surgical History:  Procedure Laterality Date   CESAREAN SECTION  1990   x2 4098,1191   CHOLECYSTECTOMY     2019   MASTECTOMY, RADICAL Bilateral 1996   TOTAL KNEE ARTHROPLASTY Left 12/21/2022   Procedure: TOTAL KNEE ARTHROPLASTY;  Surgeon: Durene Romans, MD;  Location: [facility];  Service: Orthopedics;  Laterality: Left;   Patient Active Problem List   Diagnosis Date Noted   S/P total knee arthroplasty, 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    PCP: Enid Baas, MD  REFERRING PROVIDER: Cassandria Anger, PA-C  REFERRING DIAG: Osteoarthritis of left knee joint  THERAPY DIAG:  Hx of total knee arthroplasty, left  Joint stiffness of knee, left  Muscle weakness (generalized)  Acute pain of left knee  Gait difficulty  Rationale for Evaluation and Treatment: Rehabilitation  ONSET DATE: 12/21/2022  SUBJECTIVE:   SUBJECTIVE STATEMENT: Pt. S/p L TKA on 12/21/22 and reports 8/10 L knee pain.  Pt. Reports chronic h/o L knee pain resulting in TKA.  Pt. Hoping to return to work by early 2025.  Pt. Enjoys traveling and going to beach.    PERTINENT HISTORY: See MD note  PAIN:  Are you having pain? Yes: NPRS scale: 8/10 Pain location: L knee Pain description: deep aching/ sharp Aggravating factors: knee ROM Relieving factors: rest/ ice / meds  PRECAUTIONS: Knee  RED  FLAGS: None   WEIGHT BEARING RESTRICTIONS: No  FALLS:  Has patient fallen in last 6 months? No  LIVING ENVIRONMENT: Lives with: lives with their spouse Lives in: House/apartment Stairs: Yes: External: 5 steps; on right going up  Has ramp in back of house.   Has following equipment at home: Dan Humphreys - 2 wheeled  OCCUPATION: Case Production designer, theatre/television/film for workers Producer, television/film/video.    PLOF: Independent  PATIENT GOALS: Increase L knee ROM/ strength/ return to work  NEXT MD VISIT: 01/05/23  OBJECTIVE:  Note: Objective measures were completed at Evaluation unless otherwise noted.  PATIENT SURVEYS:  FOTO initial 4/ goal 8  COGNITION: Overall cognitive status: Within functional limits for tasks assessed   SENSATION: WFL  EDEMA:  Circumferential: L/R : joint line (38.5/36 cm.), gastroc (33/33 cm.), distal quad (39.5/ 38 cm.).    MUSCLE LENGTH: Hamstrings: Right 80 deg; Left 74 deg  POSTURE: rounded shoulders  PALPATION: Generalized tenderness over L knee/ bandage in place  LOWER EXTREMITY ROM:  Active ROM Right eval Left eval  Hip flexion [facility]  Hip extension    Hip abduction    Hip adduction    Hip internal rotation    Hip external rotation    Knee flexion 141 deg. 98 deg. (Pain)  Knee extension 0 deg. -8 deg. (Pain)  Ankle dorsiflexion [facility]  Ankle plantarflexion [facility] WFL  Ankle inversion    Ankle eversion     (Blank rows = not tested)  LOWER EXTREMITY MMT:  MMT Right eval Left eval  Hip flexion 4 4  Hip extension    Hip abduction 4 4  Hip adduction    Hip internal rotation    Hip external rotation    Knee flexion 5 4-  Knee extension 5 4-  Ankle dorsiflexion    Ankle plantarflexion    Ankle inversion    Ankle eversion     (Blank rows = not tested)  FUNCTIONAL TESTS:  5 times sit to stand: TBD  GAIT: Distance walked: in clinic Assistive device utilized: Walker - 2 wheeled Level of assistance: Modified independence Comments: L antalgic gait with  limited L hip/knee flexion and step pattern.  Increase L knee pain with wt. Bearing and heavy UE assist on RW.    TODAY'S TREATMENT:                                                                                                                              DATE: 12/28/2022  Subjective:  Pt. Reports she is having more pain at night while sleeping.  Pt. Continues to ambulate with use of RW.  Pt. Focus on L knee ROM stretches/ HEP.    Manual tx.:  Supine L patellar mobs. (All planes)- STM to L distal quad/ hamstring (minimal bruising).    Supine L knee AA/PROM (flexion/ extension)- 10x2 with static holds as tolerated.    There.ex.:  Walking in //-bars with high marching/ knee flexion 3 laps.    See HEP   PATIENT EDUCATION:  Education details: Access Code: [email or web address] Person educated: Patient Education method: Medical illustrator Education comprehension: verbalized understanding and returned demonstration  HOME EXERCISE PROGRAM: Access Code: [email or web address] URL: [email or web address] Date: 12/27/2022 Prepared by: Dorene Grebe  Exercises - Supine Heel Slide with Strap  - 2 x daily - 7 x weekly - 2 sets - 10 reps - Active Straight Leg Raise with Quad Set  - 2 x daily - 7 x weekly - 2 sets - 10 reps - Sidelying Hip Abduction  - 2 x daily - 7 x weekly - 2 sets - 10 reps - Supine Quad Set  - 2 x daily - 7 x weekly - 2 sets - 10 reps - Seated Knee Flexion AAROM  - 2 x daily - 7 x weekly - 2 sets - 10 reps - Seated Long Arc Quad  - 2 x daily - 7 x weekly - 2 sets - 10 reps  ASSESSMENT:  CLINICAL IMPRESSION: Pt. Reports 8/10 L knee pain currently at rest and ambulates with L antalgic gait (limited L hip/knee flexion) with use of RW.  Pt. Progressing well with L knee ROM and demonstrates >104 deg. flexion.  Pt. Ambulates with more consistent heel strike/ L knee extension while using RW.  Pt. Will continue to benefit from  use of ice to manage swelling/ pain.  Pt. Will  benefit from skilled PT services to increase L knee ROM/ strength to improve pain-free mobility/ normalized gait.    OBJECTIVE IMPAIRMENTS: Abnormal gait, decreased activity tolerance, decreased balance, decreased endurance, decreased mobility, difficulty walking, decreased ROM, decreased strength, hypomobility, increased edema, impaired flexibility, and pain.   ACTIVITY LIMITATIONS: carrying, lifting, bending, sitting, standing, squatting, stairs, transfers, and locomotion level  PARTICIPATION LIMITATIONS: cleaning, laundry, driving, shopping, community activity, and occupation  PERSONAL FACTORS: Fitness and Profession are also affecting patient's functional outcome.   REHAB POTENTIAL: Good  CLINICAL DECISION MAKING: Stable/uncomplicated  EVALUATION COMPLEXITY: Low   GOALS: Goals reviewed with patient? Yes  SHORT TERM GOALS: Target date: 01/14/23 Pt. Will be independent with HEP to increase L knee AROM (0 to >115 deg) to improve pain-free mobility.   Baseline:  -8 to 98 deg. (Pain limited) Goal status: INITIAL   LONG TERM GOALS: Target date: 02/04/23  Pt. Will increase FOTO to 53 to improve pain-free mobility.   Baseline: initial 4 Goal status: INITIAL  2.  Pt. Able to ascend/ descend 5 steps with recip. Pattern and no c/o L knee pain to improve entering house.   Baseline: using ramp at home Goal status: INITIAL  3.  Pt. Will ambulate with normalized gait pattern and recip. Gait pattern/ heel strike to improve return to work.   Baseline: L antalgic gait with RW.  Goal status: INITIAL  PLAN:  PT FREQUENCY: 2x/week  PT DURATION: 6 weeks  PLANNED INTERVENTIONS: 97110-Therapeutic exercises, 97530- Therapeutic activity, 97112- Neuromuscular re-education, 97535- Self Care, 40102- Manual therapy, [phone]- Gait training, [phone]- Electrical stimulation (unattended), Patient/Family education, Balance training, Stair training, Joint mobilization, Scar mobilization, DME instructions,  and Cryotherapy  PLAN FOR NEXT SESSION: Send MD progress note  Cammie Mcgee, PT, DPT # [phone] 12/28/2022, 6:31 PM

## 2022-12-28 NOTE — Therapy (Signed)
OUTPATIENT PHYSICAL THERAPY LOWER EXTREMITY EVALUATION   Patient Name: Belinda Frazier MRN: [id number] DOB:27-Jul-1955, 67 y.o., female Today's Date: 12/24/2022  END OF SESSION:  PT End of Session - 12/28/22 1801     Visit Number 1    Number of Visits 12    Date for PT Re-Evaluation 02/04/23    PT Start Time 0902    PT Stop Time 0950    PT Time Calculation (min) 48 min             Past Medical History:  Diagnosis Date   Anemia    Arthritis    Breast cancer (HCC) left breast   bilateral mastectomy   Chronic kidney disease    stage III   Diabetes mellitus without complication [facility])    Past Surgical History:  Procedure Laterality Date   CESAREAN SECTION  1990   x2 7253,6644   CHOLECYSTECTOMY     2019   MASTECTOMY, RADICAL Bilateral 1996   TOTAL KNEE ARTHROPLASTY Left 12/21/2022   Procedure: TOTAL KNEE ARTHROPLASTY;  Surgeon: Durene Romans, MD;  Location: [facility];  Service: Orthopedics;  Laterality: Left;   Patient Active Problem List   Diagnosis Date Noted   S/P total knee arthroplasty, 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    PCP: Enid Baas, MD  REFERRING PROVIDER: Cassandria Anger, PA-C  REFERRING DIAG: Osteoarthritis of left knee joint  THERAPY DIAG:  Hx of total knee arthroplasty, left  Joint stiffness of knee, left  Muscle weakness (generalized)  Acute pain of left knee  Gait difficulty  Rationale for Evaluation and Treatment: Rehabilitation  ONSET DATE: 12/21/2022  SUBJECTIVE:   SUBJECTIVE STATEMENT: Pt. S/p L TKA on 12/21/22 and reports 8/10 L knee pain.  Pt. Reports chronic h/o L knee pain resulting in TKA.  Pt. Hoping to return to work by early 2025.  Pt. Enjoys traveling and going to beach.    PERTINENT HISTORY: See MD note  PAIN:  Are you having pain? Yes: NPRS scale: 8/10 Pain location: L knee Pain description: deep aching/ sharp Aggravating factors: knee ROM Relieving factors: rest/ ice / meds  PRECAUTIONS: Knee  RED  FLAGS: None   WEIGHT BEARING RESTRICTIONS: No  FALLS:  Has patient fallen in last 6 months? No  LIVING ENVIRONMENT: Lives with: lives with their spouse Lives in: House/apartment Stairs: Yes: External: 5 steps; on right going up  Has ramp in back of house.   Has following equipment at home: Dan Humphreys - 2 wheeled  OCCUPATION: Case Production designer, theatre/television/film for workers Producer, television/film/video.    PLOF: Independent  PATIENT GOALS: Increase L knee ROM/ strength/ return to work  NEXT MD VISIT: 01/05/23  OBJECTIVE:  Note: Objective measures were completed at Evaluation unless otherwise noted.  PATIENT SURVEYS:  FOTO initial 4/ goal 65  COGNITION: Overall cognitive status: Within functional limits for tasks assessed   SENSATION: WFL  EDEMA:  Circumferential: L/R : joint line (38.5/36 cm.), gastroc (33/33 cm.), distal quad (39.5/ 38 cm.).    MUSCLE LENGTH: Hamstrings: Right 80 deg; Left 74 deg  POSTURE: rounded shoulders  PALPATION: Generalized tenderness over L knee/ bandage in place  LOWER EXTREMITY ROM:  Active ROM Right eval Left eval  Hip flexion [facility]  Hip extension    Hip abduction    Hip adduction    Hip internal rotation    Hip external rotation    Knee flexion 141 deg. 98 deg. (Pain)  Knee extension 0 deg. -8 deg. (Pain)  Ankle dorsiflexion [facility]  Ankle plantarflexion [facility] WFL  Ankle inversion    Ankle eversion     (Blank rows = not tested)  LOWER EXTREMITY MMT:  MMT Right eval Left eval  Hip flexion 4 4  Hip extension    Hip abduction 4 4  Hip adduction    Hip internal rotation    Hip external rotation    Knee flexion 5 4-  Knee extension 5 4-  Ankle dorsiflexion    Ankle plantarflexion    Ankle inversion    Ankle eversion     (Blank rows = not tested)  FUNCTIONAL TESTS:  5 times sit to stand: TBD  GAIT: Distance walked: in clinic Assistive device utilized: Walker - 2 wheeled Level of assistance: Modified independence Comments: L antalgic gait with  limited L hip/knee flexion and step pattern.  Increase L knee pain with wt. Bearing and heavy UE assist on RW.    TODAY'S TREATMENT:                                                                                                                              DATE: 12/24/22  Evaluation/  Pt. Will continue with current HEP and focus on L knee extension.  Icing   PATIENT EDUCATION:  Education details: Reviewed HEP Person educated: Patient Education method: Medical illustrator Education comprehension: verbalized understanding and returned demonstration  HOME EXERCISE PROGRAM: Will issue new HEP next tx. Session.   ASSESSMENT:  CLINICAL IMPRESSION: Patient is a pleasant 67 y.o. female who was seen today for physical therapy evaluation and treatment for L TKA.  Pt. Reports 8/10 L knee pain currently at rest and ambulates with L antalgic gait (limited L hip/knee flexion) with use of RW.  Pt. Presents with limited L knee AROM (-8 to 98 deg.) and strength deficits.  Pt. Will benefit from skilled PT services to increase L knee ROM/ strength to improve pain-free mobility/ normalized gait.    OBJECTIVE IMPAIRMENTS: Abnormal gait, decreased activity tolerance, decreased balance, decreased endurance, decreased mobility, difficulty walking, decreased ROM, decreased strength, hypomobility, increased edema, impaired flexibility, and pain.   ACTIVITY LIMITATIONS: carrying, lifting, bending, sitting, standing, squatting, stairs, transfers, and locomotion level  PARTICIPATION LIMITATIONS: cleaning, laundry, driving, shopping, community activity, and occupation  PERSONAL FACTORS: Fitness and Profession are also affecting patient's functional outcome.   REHAB POTENTIAL: Good  CLINICAL DECISION MAKING: Stable/uncomplicated  EVALUATION COMPLEXITY: Low   GOALS: Goals reviewed with patient? Yes  SHORT TERM GOALS: Target date: 01/14/23 Pt. Will be independent with HEP to increase L knee AROM  (0 to >115 deg) to improve pain-free mobility.   Baseline:  -8 to 98 deg. (Pain limited) Goal status: INITIAL   LONG TERM GOALS: Target date: 02/04/23  Pt. Will increase FOTO to 53 to improve pain-free mobility.   Baseline: initial 4 Goal status: INITIAL  2.  Pt. Able to ascend/ descend 5 steps with recip. Pattern and no c/o L knee pain to  improve entering house.   Baseline: using ramp at home Goal status: INITIAL  3.  Pt. Will ambulate with normalized gait pattern and recip. Gait pattern/ heel strike to improve return to work.   Baseline: L antalgic gait with RW.  Goal status: INITIAL  PLAN:  PT FREQUENCY: 2x/week  PT DURATION: 6 weeks  PLANNED INTERVENTIONS: 97110-Therapeutic exercises, 97530- Therapeutic activity, [id number]- Neuromuscular re-education, 97535- Self Care, 40981- Manual therapy, [phone]- Gait training, [phone]- Electrical stimulation (unattended), Patient/Family education, Balance training, Stair training, Joint mobilization, Scar mobilization, DME instructions, and Cryotherapy  PLAN FOR NEXT SESSION: Issue HEP  Cammie Mcgee, PT, DPT # [phone] 12/28/2022, 6:06 PM

## 2022-12-29 ENCOUNTER — Ambulatory Visit: Payer: Medicare HMO | Admitting: Physical Therapy

## 2022-12-29 ENCOUNTER — Encounter: Payer: Self-pay | Admitting: Physical Therapy

## 2022-12-29 DIAGNOSIS — M25662 Stiffness of left knee, not elsewhere classified: Secondary | ICD-10-CM

## 2022-12-29 DIAGNOSIS — R269 Unspecified abnormalities of gait and mobility: Secondary | ICD-10-CM

## 2022-12-29 DIAGNOSIS — Z96652 Presence of left artificial knee joint: Secondary | ICD-10-CM

## 2022-12-29 DIAGNOSIS — M6281 Muscle weakness (generalized): Secondary | ICD-10-CM

## 2022-12-29 DIAGNOSIS — M25562 Pain in left knee: Secondary | ICD-10-CM

## 2022-12-29 NOTE — Therapy (Signed)
OUTPATIENT PHYSICAL THERAPY LOWER EXTREMITY TREATMENT   Patient Name: Belinda Frazier MRN: [id number] DOB:09-27-1955, 67 y.o., female Today's Date: 12/29/2022   END OF SESSION:  PT End of Session - 12/29/22 0932     Visit Number 3    Number of Visits 12    Date for PT Re-Evaluation 02/04/23    PT Start Time 0951    PT Stop Time 1036    PT Time Calculation (min) 45 min             Past Medical History:  Diagnosis Date   Anemia    Arthritis    Breast cancer (HCC) left breast   bilateral mastectomy   Chronic kidney disease    stage III   Diabetes mellitus without complication [facility])    Past Surgical History:  Procedure Laterality Date   CESAREAN SECTION  1990   x2 [id number]   CHOLECYSTECTOMY     2019   MASTECTOMY, RADICAL Bilateral 1996   TOTAL KNEE ARTHROPLASTY Left 12/21/2022   Procedure: TOTAL KNEE ARTHROPLASTY;  Surgeon: Durene Romans, MD;  Location: [facility];  Service: Orthopedics;  Laterality: Left;   Patient Active Problem List   Diagnosis Date Noted   S/P total knee arthroplasty, 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    PCP: Enid Baas, MD  REFERRING PROVIDER: Cassandria Anger, PA-C  REFERRING DIAG: Osteoarthritis of left knee joint  THERAPY DIAG:  Hx of total knee arthroplasty, left  Joint stiffness of knee, left  Muscle weakness (generalized)  Acute pain of left knee  Gait difficulty  Rationale for Evaluation and Treatment: Rehabilitation  ONSET DATE: 12/21/2022  SUBJECTIVE:   SUBJECTIVE STATEMENT: Pt. S/p L TKA on 12/21/22 and reports 8/10 L knee pain.  Pt. Reports chronic h/o L knee pain resulting in TKA.  Pt. Hoping to return to work by early 2025.  Pt. Enjoys traveling and going to beach.    PERTINENT HISTORY: See MD note  PAIN:  Are you having pain? Yes: NPRS scale: 8/10 Pain location: L knee Pain description: deep aching/ sharp Aggravating factors: knee ROM Relieving factors: rest/ ice / meds  PRECAUTIONS: Knee  RED  FLAGS: None   WEIGHT BEARING RESTRICTIONS: No  FALLS:  Has patient fallen in last 6 months? No  LIVING ENVIRONMENT: Lives with: lives with their spouse Lives in: House/apartment Stairs: Yes: External: 5 steps; on right going up  Has ramp in back of house.   Has following equipment at home: Dan Humphreys - 2 wheeled  OCCUPATION: Case Production designer, theatre/television/film for workers Producer, television/film/video.    PLOF: Independent  PATIENT GOALS: Increase L knee ROM/ strength/ return to work  NEXT MD VISIT: 01/05/23  OBJECTIVE:  Note: Objective measures were completed at Evaluation unless otherwise noted.  PATIENT SURVEYS:  FOTO initial 4/ goal 41  COGNITION: Overall cognitive status: Within functional limits for tasks assessed   SENSATION: WFL  EDEMA:  Circumferential: L/R : joint line (38.5/36 cm.), gastroc (33/33 cm.), distal quad (39.5/ 38 cm.).    MUSCLE LENGTH: Hamstrings: Right 80 deg; Left 74 deg  POSTURE: rounded shoulders  PALPATION: Generalized tenderness over L knee/ bandage in place  LOWER EXTREMITY ROM:  Active ROM Right eval Left eval  Hip flexion [facility]  Hip extension    Hip abduction    Hip adduction    Hip internal rotation    Hip external rotation    Knee flexion 141 deg. 98 deg. (Pain)  Knee extension 0 deg. -8 deg. (Pain)  Ankle dorsiflexion [facility]  Ankle plantarflexion [facility] WFL  Ankle inversion    Ankle eversion     (Blank rows = not tested)  LOWER EXTREMITY MMT:  MMT Right eval Left eval  Hip flexion 4 4  Hip extension    Hip abduction 4 4  Hip adduction    Hip internal rotation    Hip external rotation    Knee flexion 5 4-  Knee extension 5 4-  Ankle dorsiflexion    Ankle plantarflexion    Ankle inversion    Ankle eversion     (Blank rows = not tested)  FUNCTIONAL TESTS:  5 times sit to stand: TBD  GAIT: Distance walked: in clinic Assistive device utilized: Walker - 2 wheeled Level of assistance: Modified independence Comments: L antalgic gait with  limited L hip/knee flexion and step pattern.  Increase L knee pain with wt. Bearing and heavy UE assist on RW.    TODAY'S TREATMENT:                                                                                                                              DATE: 12/29/2022  Subjective:  Pt. Slept well last night and reports minimal pain this morning but feels tired.  Pt. Entered PT with stiff L knee/ antalgic gait with use of RW.  Pt. Reports 5/10 L knee pain with standing L knee flexion at stairs.  Pt. Returns to MD next Wednesday and moved PT treatment from Wednesday to Thursday.    There.ex.:  Standing 1st/2nd step L knee flexion/ extension stretches 10x each with holds.  Guarded/ pain limited.    Standing at //-bars: marching/ hip abducting/ knee flexion 10x2 (fatigue noted).  Requires UE assist for balance/ safety.    Supine L hip flexion with added SAQ 8x (fatigue).  Supine quad sets with manual feedback/ heel slides 10x each.    Seated LAQ 10x with short holds (pain tolerable range).    Reviewed HEP  Manual tx.:  Supine L patellar mobs. (All planes)- STM to L distal quad/ hamstring (minimal c/o tenderness with L medial knee soft tissue tightness noted).    Supine L knee AA/PROM (flexion/ extension)- 10x with static holds as tolerated.  Supine L hamstring stretches 4x (guarded).    Supine L knee AROM at end of tx.:  -4 to 110 deg.    Pt. Will ice L knee at home today.    PATIENT EDUCATION:  Education details: Access Code: [email or web address] Person educated: Patient Education method: Medical illustrator Education comprehension: verbalized understanding and returned demonstration  HOME EXERCISE PROGRAM: Access Code: [email or web address] URL: [email or web address] Date: 12/27/2022 Prepared by: Dorene Grebe  Exercises - Supine Heel Slide with Strap  - 2 x daily - 7 x weekly - 2 sets - 10 reps - Active Straight Leg Raise with Quad Set  - 2 x daily - 7 x weekly - 2  sets - 10 reps - Sidelying Hip Abduction  - 2  x daily - 7 x weekly - 2 sets - 10 reps - Supine Quad Set  - 2 x daily - 7 x weekly - 2 sets - 10 reps - Seated Knee Flexion AAROM  - 2 x daily - 7 x weekly - 2 sets - 10 reps - Seated Long Arc Quad  - 2 x daily - 7 x weekly - 2 sets - 10 reps  ASSESSMENT:  CLINICAL IMPRESSION: Pt. Reports 5/10 L knee pain currently at rest and ambulates with L antalgic gait (limited L hip/knee flexion) with use of RW.  Pt. Instructed to increase L hip/knee flexion and step pattern to decrease antalgic gait pattern/improve heel strike.  Pt. Progressing well with L knee ROM and demonstrates >108 deg. Flexion (pain limited).  Pt. Limited by generalized muscle fatigue and requires several short rest breaks during there.ex.  Pt. Will continue to benefit from use of ice to manage swelling/ pain after tx.  Pt. Will benefit from skilled PT services to increase L knee ROM/ strength to improve pain-free mobility/ normalized gait.    OBJECTIVE IMPAIRMENTS: Abnormal gait, decreased activity tolerance, decreased balance, decreased endurance, decreased mobility, difficulty walking, decreased ROM, decreased strength, hypomobility, increased edema, impaired flexibility, and pain.   ACTIVITY LIMITATIONS: carrying, lifting, bending, sitting, standing, squatting, stairs, transfers, and locomotion level  PARTICIPATION LIMITATIONS: cleaning, laundry, driving, shopping, community activity, and occupation  PERSONAL FACTORS: Fitness and Profession are also affecting patient's functional outcome.   REHAB POTENTIAL: Good  CLINICAL DECISION MAKING: Stable/uncomplicated  EVALUATION COMPLEXITY: Low   GOALS: Goals reviewed with patient? Yes  SHORT TERM GOALS: Target date: 01/14/23 Pt. Will be independent with HEP to increase L knee AROM (0 to >115 deg) to improve pain-free mobility.   Baseline:  -8 to 98 deg. (Pain limited) Goal status: INITIAL   LONG TERM GOALS: Target date:  02/04/23  Pt. Will increase FOTO to 53 to improve pain-free mobility.   Baseline: initial 4 Goal status: INITIAL  2.  Pt. Able to ascend/ descend 5 steps with recip. Pattern and no c/o L knee pain to improve entering house.   Baseline: using ramp at home Goal status: INITIAL  3.  Pt. Will ambulate with normalized gait pattern and recip. Gait pattern/ heel strike to improve return to work.   Baseline: L antalgic gait with RW.  Goal status: INITIAL  PLAN:  PT FREQUENCY: 2x/week  PT DURATION: 6 weeks  PLANNED INTERVENTIONS: 97110-Therapeutic exercises, 97530- Therapeutic activity, 97112- Neuromuscular re-education, 97535- Self Care, 19147- Manual therapy, [phone]- Gait training, [phone]- Electrical stimulation (unattended), Patient/Family education, Balance training, Stair training, Joint mobilization, Scar mobilization, DME instructions, and Cryotherapy  PLAN FOR NEXT SESSION: Send MD progress note  Cammie Mcgee, PT, DPT # [phone] 12/29/2022, 10:40 AM

## 2023-01-03 ENCOUNTER — Ambulatory Visit: Payer: Medicare HMO | Attending: Student | Admitting: Physical Therapy

## 2023-01-03 ENCOUNTER — Encounter: Payer: Self-pay | Admitting: Physical Therapy

## 2023-01-03 DIAGNOSIS — M6281 Muscle weakness (generalized): Secondary | ICD-10-CM | POA: Insufficient documentation

## 2023-01-03 DIAGNOSIS — M25662 Stiffness of left knee, not elsewhere classified: Secondary | ICD-10-CM | POA: Diagnosis present

## 2023-01-03 DIAGNOSIS — Z96652 Presence of left artificial knee joint: Secondary | ICD-10-CM | POA: Insufficient documentation

## 2023-01-03 DIAGNOSIS — M25562 Pain in left knee: Secondary | ICD-10-CM | POA: Insufficient documentation

## 2023-01-03 DIAGNOSIS — R269 Unspecified abnormalities of gait and mobility: Secondary | ICD-10-CM | POA: Insufficient documentation

## 2023-01-03 NOTE — Therapy (Addendum)
OUTPATIENT PHYSICAL THERAPY LOWER EXTREMITY TREATMENT   Patient Name: Belinda Frazier MRN: [id number] DOB:04-29-1955, 67 y.o., female Today's Date: 01/03/2023   END OF SESSION:  PT End of Session - 01/03/23 0850     Visit Number 4    Number of Visits 12    Date for PT Re-Evaluation 02/04/23    PT Start Time 0938    PT Stop Time 1034    PT Time Calculation (min) 56 min             Past Medical History:  Diagnosis Date   Anemia    Arthritis    Breast cancer (HCC) left breast   bilateral mastectomy   Chronic kidney disease    stage III   Diabetes mellitus without complication [facility])    Past Surgical History:  Procedure Laterality Date   CESAREAN SECTION  1990   x2 [id number]   CHOLECYSTECTOMY     2019   MASTECTOMY, RADICAL Bilateral 1996   TOTAL KNEE ARTHROPLASTY Left 12/21/2022   Procedure: TOTAL KNEE ARTHROPLASTY;  Surgeon: Durene Romans, MD;  Location: [facility];  Service: Orthopedics;  Laterality: Left;   Patient Active Problem List   Diagnosis Date Noted   S/P total knee arthroplasty, 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    PCP: Enid Baas, MD  REFERRING PROVIDER: Cassandria Anger, PA-C  REFERRING DIAG: Osteoarthritis of left knee joint  THERAPY DIAG:  Hx of total knee arthroplasty, left  Joint stiffness of knee, left  Muscle weakness (generalized)  Acute pain of left knee  Gait difficulty  Rationale for Evaluation and Treatment: Rehabilitation  ONSET DATE: 12/21/2022  SUBJECTIVE:   SUBJECTIVE STATEMENT: Pt. S/p L TKA on 12/21/22 and reports 8/10 L knee pain.  Pt. Reports chronic h/o L knee pain resulting in TKA.  Pt. Hoping to return to work by early 2025.  Pt. Enjoys traveling and going to beach.    PERTINENT HISTORY: See MD note  PAIN:  Are you having pain? Yes: NPRS scale: 8/10 Pain location: L knee Pain description: deep aching/ sharp Aggravating factors: knee ROM Relieving factors: rest/ ice / meds  PRECAUTIONS: Knee  RED  FLAGS: None   WEIGHT BEARING RESTRICTIONS: No  FALLS:  Has patient fallen in last 6 months? No  LIVING ENVIRONMENT: Lives with: lives with their spouse Lives in: House/apartment Stairs: Yes: External: 5 steps; on right going up  Has ramp in back of house.   Has following equipment at home: Dan Humphreys - 2 wheeled  OCCUPATION: Case Production designer, theatre/television/film for workers Producer, television/film/video.    PLOF: Independent  PATIENT GOALS: Increase L knee ROM/ strength/ return to work  NEXT MD VISIT: 01/05/23  OBJECTIVE:  Note: Objective measures were completed at Evaluation unless otherwise noted.  PATIENT SURVEYS:  FOTO initial 4/ goal 91  COGNITION: Overall cognitive status: Within functional limits for tasks assessed   SENSATION: WFL  EDEMA:  Circumferential: L/R : joint line (38.5/36 cm.), gastroc (33/33 cm.), distal quad (39.5/ 38 cm.).    MUSCLE LENGTH: Hamstrings: Right 80 deg; Left 74 deg  POSTURE: rounded shoulders  PALPATION: Generalized tenderness over L knee/ bandage in place  LOWER EXTREMITY ROM:  Active ROM Right eval Left eval  Hip flexion [facility]  Hip extension    Hip abduction    Hip adduction    Hip internal rotation    Hip external rotation    Knee flexion 141 deg. 98 deg. (Pain)  Knee extension 0 deg. -8 deg. (Pain)  Ankle dorsiflexion [facility]  Ankle plantarflexion [facility] WFL  Ankle inversion    Ankle eversion     (Blank rows = not tested)  LOWER EXTREMITY MMT:  MMT Right eval Left eval  Hip flexion 4 4  Hip extension    Hip abduction 4 4  Hip adduction    Hip internal rotation    Hip external rotation    Knee flexion 5 4-  Knee extension 5 4-  Ankle dorsiflexion    Ankle plantarflexion    Ankle inversion    Ankle eversion     (Blank rows = not tested)  FUNCTIONAL TESTS:  5 times sit to stand: TBD  GAIT: Distance walked: in clinic Assistive device utilized: Walker - 2 wheeled Level of assistance: Modified independence Comments: L antalgic gait with  limited L hip/knee flexion and step pattern.  Increase L knee pain with wt. Bearing and heavy UE assist on RW.    TODAY'S TREATMENT:                                                                                                                              DATE: 01/03/2023  Subjective:  Pt. Reports 5/10 L medial knee pain with walking into PT clinic.  Pt. Continues to use RW but has been taking several steps at home without assistive device. Pt. Returns to MD on Wednesday.  There.ex.:  Nustep L6-4 L2-1 B UE/LE for 10 min.  Warm-up prior to manual stretches.    Walking in //-bars with less UE assist working on recip. Step pattern/ heel strike 5 laps.  Transition to use of SPC on R with recip. Step pattern and cuing for more consistent step length/ heel strike.    Standing 2nd step L knee flexion/ extension stretches 8x each with holds.  Guarded/ pain limited.    Standing at //-bars: marching/ hip abducting/ knee flexion 10x2 (fatigue noted).  Light UE assist for balance/ safety.    Supine L/R hip flexion 10x each.  Supine quad sets with manual feedback/ heel slides 10x each.    Seated LAQ 10x with short holds (pain tolerable range).    Reviewed HEP  Manual tx.:  Supine L patellar mobs. (All planes)- STM to L distal quad/ hamstring (minimal c/o tenderness with L medial knee soft tissue tightness noted).    Supine L knee AA/PROM (flexion/ extension)- 10x with static holds as tolerated.  Supine L hamstring stretches 4x (guarded).    Supine L knee AROM at end of tx.:  -4 to 110 deg.  (Pain limited).    Pt. Will ice L knee at home today.    PATIENT EDUCATION:  Education details: Access Code: [email or web address] Person educated: Patient Education method: Medical illustrator Education comprehension: verbalized understanding and returned demonstration  HOME EXERCISE PROGRAM: Access Code: [email or web address] URL: [email or web address] Date: 12/27/2022 Prepared by: Dorene Grebe  Exercises - Supine Heel Slide with Strap  - 2 x daily - 7 x weekly - 2 sets -  10 reps - Active Straight Leg Raise with Quad Set  - 2 x daily - 7 x weekly - 2 sets - 10 reps - Sidelying Hip Abduction  - 2 x daily - 7 x weekly - 2 sets - 10 reps - Supine Quad Set  - 2 x daily - 7 x weekly - 2 sets - 10 reps - Seated Knee Flexion AAROM  - 2 x daily - 7 x weekly - 2 sets - 10 reps - Seated Long Arc Quad  - 2 x daily - 7 x weekly - 2 sets - 10 reps  ASSESSMENT:  CLINICAL IMPRESSION: Pt. Reports 5/10 L knee pain currently at rest and ambulates with L antalgic gait (limited L hip/knee flexion) with use of RW.  Pt. Instructed to increase L hip/knee flexion and step pattern to decrease antalgic gait pattern/improve heel strike.  Pt. Demonstrates good technique with SPC with no balance/ safety issues.  Pt. Progressing well with L knee ROM and demonstrates >108 deg. Flexion (pain limited).  Pt. Limited by generalized muscle fatigue and requires several short rest breaks during there.ex.  Pt. Will continue to benefit from use of ice to manage swelling/ pain after tx.  Pt. Will benefit from skilled PT services to increase L knee ROM/ strength to improve pain-free mobility/ normalized gait.    OBJECTIVE IMPAIRMENTS: Abnormal gait, decreased activity tolerance, decreased balance, decreased endurance, decreased mobility, difficulty walking, decreased ROM, decreased strength, hypomobility, increased edema, impaired flexibility, and pain.   ACTIVITY LIMITATIONS: carrying, lifting, bending, sitting, standing, squatting, stairs, transfers, and locomotion level  PARTICIPATION LIMITATIONS: cleaning, laundry, driving, shopping, community activity, and occupation  PERSONAL FACTORS: Fitness and Profession are also affecting patient's functional outcome.   REHAB POTENTIAL: Good  CLINICAL DECISION MAKING: Stable/uncomplicated  EVALUATION COMPLEXITY: Low   GOALS: Goals reviewed with patient? Yes  SHORT  TERM GOALS: Target date: 01/14/23 Pt. Will be independent with HEP to increase L knee AROM (0 to >115 deg) to improve pain-free mobility.   Baseline:  -8 to 98 deg. (Pain limited) Goal status: INITIAL   LONG TERM GOALS: Target date: 02/04/23  Pt. Will increase FOTO to 53 to improve pain-free mobility.   Baseline: initial 4 Goal status: INITIAL  2.  Pt. Able to ascend/ descend 5 steps with recip. Pattern and no c/o L knee pain to improve entering house.   Baseline: using ramp at home Goal status: INITIAL  3.  Pt. Will ambulate with normalized gait pattern and recip. Gait pattern/ heel strike to improve return to work.   Baseline: L antalgic gait with RW.  Goal status: INITIAL  PLAN:  PT FREQUENCY: 2x/week  PT DURATION: 6 weeks  PLANNED INTERVENTIONS: 97110-Therapeutic exercises, 97530- Therapeutic activity, [id number]- Neuromuscular re-education, 97535- Self Care, 16109- Manual therapy, [phone]- Gait training, [phone]- Electrical stimulation (unattended), Patient/Family education, Balance training, Stair training, Joint mobilization, Scar mobilization, DME instructions, and Cryotherapy  PLAN FOR NEXT SESSION: Discuss MD f/u appt.  Focus on quad/hip strengthening and knee ROM  Cammie Mcgee, PT, DPT # [id number] 01/03/2023, 10:55 AM

## 2023-01-05 ENCOUNTER — Ambulatory Visit: Payer: Medicare HMO | Admitting: Physical Therapy

## 2023-01-06 ENCOUNTER — Encounter: Payer: Self-pay | Admitting: Physical Therapy

## 2023-01-06 ENCOUNTER — Ambulatory Visit: Payer: Medicare HMO | Admitting: Physical Therapy

## 2023-01-06 DIAGNOSIS — R269 Unspecified abnormalities of gait and mobility: Secondary | ICD-10-CM

## 2023-01-06 DIAGNOSIS — Z96652 Presence of left artificial knee joint: Secondary | ICD-10-CM | POA: Diagnosis not present

## 2023-01-06 DIAGNOSIS — M25662 Stiffness of left knee, not elsewhere classified: Secondary | ICD-10-CM

## 2023-01-06 DIAGNOSIS — M6281 Muscle weakness (generalized): Secondary | ICD-10-CM

## 2023-01-06 DIAGNOSIS — M25562 Pain in left knee: Secondary | ICD-10-CM

## 2023-01-06 NOTE — Discharge Summary (Signed)
Patient ID: Belinda Frazier MRN: [id number] DOB/AGE: December 05, 1955 67 y.o.  Admit date: 12/21/2022 Discharge date: 12/22/2022  Admission Diagnoses:  Left knee osteoarthritis  Discharge Diagnoses:  Principal Problem:   S/P total knee arthroplasty, left   Past Medical History:  Diagnosis Date   Anemia    Arthritis    Breast cancer (HCC) left breast   bilateral mastectomy   Chronic kidney disease    stage III   Diabetes mellitus without complication (HCC)     Surgeries: Procedure(s): TOTAL KNEE ARTHROPLASTY on 12/21/2022   Consultants:   Discharged Condition: Improved  Hospital Course: Belinda Frazier is an 67 y.o. female who was admitted 12/21/2022 for operative treatment ofS/P total knee arthroplasty, left. Patient has severe unremitting pain that affects sleep, daily activities, and work/hobbies. After pre-op clearance the patient was taken to the operating room on 12/21/2022 and underwent  Procedure(s): TOTAL KNEE ARTHROPLASTY.    Patient was given perioperative antibiotics:  Anti-infectives (From admission, onward)    Start     Dose/Rate Route Frequency Ordered Stop   12/21/22 1400  ceFAZolin (ANCEF) IVPB 2g/100 mL premix        2 g 200 mL/hr over 30 Minutes Intravenous Every 6 hours 12/21/22 1148 12/22/22 0852   12/21/22 0600  ceFAZolin (ANCEF) IVPB 2g/100 mL premix        2 g 200 mL/hr over 30 Minutes Intravenous On call to O.R. 12/21/22 0532 12/21/22 4098        Patient was given sequential compression devices, early ambulation, and chemoprophylaxis to prevent DVT. Patient worked with PT and was meeting their goals regarding safe ambulation and transfers.  Patient benefited maximally from hospital stay and there were no complications.    Recent vital signs: No data found.   Recent laboratory studies: No results for input(s): "WBC", "HGB", "HCT", "PLT", "NA", "K", "CL", "CO2", "BUN", "CREATININE", "GLUCOSE", "INR", "CALCIUM" in the last 72 hours.  Invalid  input(s): "PT", "2"   Discharge Medications:   Allergies as of 12/22/2022       Reactions   Sulfa Antibiotics Rash        Medication List     STOP taking these medications    aspirin EC 81 MG tablet Replaced by: aspirin 81 MG chewable tablet       TAKE these medications    aspirin 81 MG chewable tablet Chew 1 tablet (81 mg total) by mouth 2 (two) times daily for 28 days. Replaces: aspirin EC 81 MG tablet   fenofibrate 145 MG tablet Commonly known as: TRICOR Take 145 mg by mouth in the morning.   ferrous sulfate 325 (65 FE) MG tablet Take 1 tablet (325 mg total) by mouth 2 (two) times daily with a meal for 14 days. Take for two weeks as tolerated.   FISH OIL PO Take 1 capsule by mouth in the morning. Notes to patient: Resume home regimen   gabapentin 300 MG capsule Commonly known as: NEURONTIN Take 300 mg by mouth at bedtime.   glipiZIDE 10 MG tablet Commonly known as: GLUCOTROL Take 5 mg by mouth daily before breakfast.   losartan 25 MG tablet Commonly known as: COZAAR Take 25 mg by mouth in the morning. Notes to patient: Resume home regimen   metFORMIN 1000 MG tablet Commonly known as: GLUCOPHAGE Take 500 mg by mouth in the morning and at bedtime.   methocarbamol 500 MG tablet Commonly known as: ROBAXIN Take 1 tablet (500 mg total) by mouth every 6 (six)  hours as needed for muscle spasms. Notes to patient: Last dose given 11/20 08:37am   Mounjaro 10 MG/0.5ML Pen Generic drug: tirzepatide Inject 10 mg into the skin every Saturday. Notes to patient: Resume home regimen   oxyCODONE 5 MG immediate release tablet Commonly known as: Oxy IR/ROXICODONE Take 1 tablet (5 mg total) by mouth every 4 (four) hours as needed for severe pain (pain score 7-10). Notes to patient: Last dose given 11/20 08:37am   pioglitazone 15 MG tablet Commonly known as: ACTOS Take 15 mg by mouth in the morning.   polyethylene glycol 17 g packet Commonly known as: MIRALAX  / GLYCOLAX Take 17 g by mouth 2 (two) times daily.   rosuvastatin 10 MG tablet Commonly known as: CRESTOR Take 10 mg by mouth in the morning.   VITAMIN B-12 PO Take 1 tablet by mouth in the morning. Notes to patient: Resume home regimen       ASK your doctor about these medications    senna 8.6 MG Tabs tablet Commonly known as: SENOKOT Take 2 tablets (17.2 mg total) by mouth at bedtime for 14 days. Ask about: Should I take this medication?               Discharge Care Instructions  (From admission, onward)           Start     Ordered   12/22/22 0000  Change dressing       Comments: Maintain surgical dressing until follow up in the clinic. If the edges start to pull up, may reinforce with tape. If the dressing is no longer working, may remove and cover with gauze and tape, but must keep the area dry and clean.  Call with any questions or concerns.   12/22/22 0803            Diagnostic Studies: No results found.  Disposition: Discharge disposition: 01-Home or Self Care       Discharge Instructions     Call MD / Call 911   Complete by: As directed    If you experience chest pain or shortness of breath, CALL 911 and be transported to the hospital emergency room.  If you develope a fever above 101 F, pus (white drainage) or increased drainage or redness at the wound, or calf pain, call your surgeon's office.   Change dressing   Complete by: As directed    Maintain surgical dressing until follow up in the clinic. If the edges start to pull up, may reinforce with tape. If the dressing is no longer working, may remove and cover with gauze and tape, but must keep the area dry and clean.  Call with any questions or concerns.   Constipation Prevention   Complete by: As directed    Drink plenty of fluids.  Prune juice may be helpful.  You may use a stool softener, such as Colace (over the counter) 100 mg twice a day.  Use MiraLax (over the counter) for  constipation as needed.   Diet - low sodium heart healthy   Complete by: As directed    Increase activity slowly as tolerated   Complete by: As directed    Weight bearing as tolerated with assist device (walker, cane, etc) as directed, use it as long as suggested by your surgeon or therapist, typically at least 4-6 weeks.   Post-operative opioid taper instructions:   Complete by: As directed    POST-OPERATIVE OPIOID TAPER INSTRUCTIONS: It is important to wean off  of your opioid medication as soon as possible. If you do not need pain medication after your surgery it is ok to stop day one. Opioids include: Codeine, Hydrocodone(Norco, Vicodin), Oxycodone(Percocet, oxycontin) and hydromorphone amongst others.  Long term and even short term use of opiods can cause: Increased pain response Dependence Constipation Depression Respiratory depression And more.  Withdrawal symptoms can include Flu like symptoms Nausea, vomiting And more Techniques to manage these symptoms Hydrate well Eat regular healthy meals Stay active Use relaxation techniques(deep breathing, meditating, yoga) Do Not substitute Alcohol to help with tapering If you have been on opioids for less than two weeks and do not have pain than it is ok to stop all together.  Plan to wean off of opioids This plan should start within one week post op of your joint replacement. Maintain the same interval or time between taking each dose and first decrease the dose.  Cut the total daily intake of opioids by one tablet each day Next start to increase the time between doses. The last dose that should be eliminated is the evening dose.      TED hose   Complete by: As directed    Use stockings (TED hose) for 2 weeks on both leg(s).  You may remove them at night for sleeping.        Follow-up Information     Durene Romans, MD. Schedule an appointment as soon as possible for a visit in 2 week(s).   Specialty: Orthopedic  Surgery Contact information: [address] [place] [address] [phone]                  Signed: Cassandria Anger 01/06/2023, 7:15 AM

## 2023-01-06 NOTE — Therapy (Signed)
OUTPATIENT PHYSICAL THERAPY LOWER EXTREMITY TREATMENT   Patient Name: Belinda Frazier MRN: [id number] DOB:December 20, 1955, 67 y.o., female Today's Date: 01/07/2023   END OF SESSION:  PT End of Session - 01/06/23 0826     Visit Number 5    Number of Visits 12    Date for PT Re-Evaluation 02/04/23    PT Start Time 0821    PT Stop Time 0910    PT Time Calculation (min) 49 min             Past Medical History:  Diagnosis Date   Anemia    Arthritis    Breast cancer (HCC) left breast   bilateral mastectomy   Chronic kidney disease    stage III   Diabetes mellitus without complication [facility])    Past Surgical History:  Procedure Laterality Date   CESAREAN SECTION  1990   x2 [id number]   CHOLECYSTECTOMY     2019   MASTECTOMY, RADICAL Bilateral 1996   TOTAL KNEE ARTHROPLASTY Left 12/21/2022   Procedure: TOTAL KNEE ARTHROPLASTY;  Surgeon: Durene Romans, MD;  Location: [facility] ORS;  Service: Orthopedics;  Laterality: Left;   Patient Active Problem List   Diagnosis Date Noted   S/P total knee arthroplasty, 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    PCP: Enid Baas, MD  REFERRING PROVIDER: Cassandria Anger, PA-C  REFERRING DIAG: Osteoarthritis of left knee joint  THERAPY DIAG:  Hx of total knee arthroplasty, left  Joint stiffness of knee, left  Muscle weakness (generalized)  Acute pain of left knee  Gait difficulty  Rationale for Evaluation and Treatment: Rehabilitation  ONSET DATE: 12/21/2022  SUBJECTIVE:   SUBJECTIVE STATEMENT: Pt. S/p L TKA on 12/21/22 and reports 8/10 L knee pain.  Pt. Reports chronic h/o L knee pain resulting in TKA.  Pt. Hoping to return to work by early 2025.  Pt. Enjoys traveling and going to beach.    PERTINENT HISTORY: See MD note  PAIN:  Are you having pain? Yes: NPRS scale: 8/10 Pain location: L knee Pain description: deep aching/ sharp Aggravating factors: knee ROM Relieving factors: rest/ ice / meds  PRECAUTIONS: Knee  RED  FLAGS: None   WEIGHT BEARING RESTRICTIONS: No  FALLS:  Has patient fallen in last 6 months? No  LIVING ENVIRONMENT: Lives with: lives with their spouse Lives in: House/apartment Stairs: Yes: External: 5 steps; on right going up  Has ramp in back of house.   Has following equipment at home: Dan Humphreys - 2 wheeled  OCCUPATION: Case Production designer, theatre/television/film for workers Producer, television/film/video.    PLOF: Independent  PATIENT GOALS: Increase L knee ROM/ strength/ return to work  NEXT MD VISIT: 01/05/23  OBJECTIVE:  Note: Objective measures were completed at Evaluation unless otherwise noted.  PATIENT SURVEYS:  FOTO initial 4/ goal 70  COGNITION: Overall cognitive status: Within functional limits for tasks assessed   SENSATION: WFL  EDEMA:  Circumferential: L/R : joint line (38.5/36 cm.), gastroc (33/33 cm.), distal quad (39.5/ 38 cm.).    MUSCLE LENGTH: Hamstrings: Right 80 deg; Left 74 deg  POSTURE: rounded shoulders  PALPATION: Generalized tenderness over L knee/ bandage in place  LOWER EXTREMITY ROM:  Active ROM Right eval Left eval  Hip flexion [facility]  Hip extension    Hip abduction    Hip adduction    Hip internal rotation    Hip external rotation    Knee flexion 141 deg. 98 deg. (Pain)  Knee extension 0 deg. -8 deg. (Pain)  Ankle dorsiflexion [facility]  Ankle plantarflexion [facility] WFL  Ankle inversion    Ankle eversion     (Blank rows = not tested)  LOWER EXTREMITY MMT:  MMT Right eval Left eval  Hip flexion 4 4  Hip extension    Hip abduction 4 4  Hip adduction    Hip internal rotation    Hip external rotation    Knee flexion 5 4-  Knee extension 5 4-  Ankle dorsiflexion    Ankle plantarflexion    Ankle inversion    Ankle eversion     (Blank rows = not tested)  FUNCTIONAL TESTS:  5 times sit to stand: TBD  GAIT: Distance walked: in clinic Assistive device utilized: Walker - 2 wheeled Level of assistance: Modified independence Comments: L antalgic gait with  limited L hip/knee flexion and step pattern.  Increase L knee pain with wt. Bearing and heavy UE assist on RW.    TODAY'S TREATMENT:                                                                                                                              DATE: 01/07/2023  Subjective:  Pt. Reports minimal L medial knee pain and primarily c/o knee stiffness in the morning with walking.  Pt. States L knee pain with increase to 5/10 with increase activity.  Pt. Was able to walk around grocery store with [facility] yesterday and standing in kitchen to cook last night.  Pt. Entered PT clinic with use of SPC and moderate antalgic gait.  Pt. Reports MD is happy with pts. Progress and pt. Returns for MD f/u 02/11/23.     There.ex.:  Walking in //-bars with less UE assist working on recip. Step pattern/ heel strike 5 laps.  Focus on recip. Step pattern and cuing for more consistent step length/ heel strike.    Nustep seat 6-4,  L3 B UE/LE for 10 min.  Warm-up prior to manual stretches.  Discussed MD appt.   TRX: knee flexion 20x.  Cuing to have equal wt. Bearing on L/R LE.  Pt. Tends to lean to R.   Supine L/R hip flexion 10x each.  Supine quad sets with manual feedback/ heel slides 10x each.    Standing 2nd step L knee flexion/ extension stretches 8x each with holds.  Guarded/ pain limited.    Seated LAQ 10x with short holds (pain tolerable range).   Standing hip ex. 2# ankle wts. in //-bars: marching/ hip abducting/ knee flexion 10x2 (fatigue noted).  Light UE assist for balance/ safety.    Reviewed HEP  Manual tx.:  Supine L patellar mobs. (All planes)- STM to L distal quad/ hamstring (minimal c/o tenderness with L medial knee soft tissue tightness noted).  Assessment of incision (skin glue in place)  Supine L knee AA/PROM (flexion/ extension)- 5x with static holds as tolerated.  Supine L hamstring stretches 3x (guarded).    Supine L knee AROM at end of tx.:  -4  to 114 deg.  (Pain limited).     Pt. Will ice L knee at home today.    PATIENT EDUCATION:  Education details: Access Code: [email or web address] Person educated: Patient Education method: Medical illustrator Education comprehension: verbalized understanding and returned demonstration  HOME EXERCISE PROGRAM: Access Code: [email or web address] URL: [email or web address] Date: 12/27/2022 Prepared by: Dorene Grebe  Exercises - Supine Heel Slide with Strap  - 2 x daily - 7 x weekly - 2 sets - 10 reps - Active Straight Leg Raise with Quad Set  - 2 x daily - 7 x weekly - 2 sets - 10 reps - Sidelying Hip Abduction  - 2 x daily - 7 x weekly - 2 sets - 10 reps - Supine Quad Set  - 2 x daily - 7 x weekly - 2 sets - 10 reps - Seated Knee Flexion AAROM  - 2 x daily - 7 x weekly - 2 sets - 10 reps - Seated Long Arc Quad  - 2 x daily - 7 x weekly - 2 sets - 10 reps  ASSESSMENT:  CLINICAL IMPRESSION: Pt. Reports 5/10 L knee pain with increase standing/ walking while using SPC.  Pt. Instructed to increase L hip/knee flexion and step pattern to decrease antalgic gait pattern/improve heel strike.   Pt. demonstrates good technique with SPC with no balance/ safety issues.  Pt. Progressing well with L knee ROM and demonstrates >114 deg. Flexion (pain limited).  Pt. Limited by generalized muscle fatigue and requires several short rest breaks during there.ex.  Pt. Will continue to benefit from use of ice to manage swelling/ pain after tx.  Pt. Will benefit from skilled PT services to increase L knee ROM/ strength to improve pain-free mobility/ normalized gait.    OBJECTIVE IMPAIRMENTS: Abnormal gait, decreased activity tolerance, decreased balance, decreased endurance, decreased mobility, difficulty walking, decreased ROM, decreased strength, hypomobility, increased edema, impaired flexibility, and pain.   ACTIVITY LIMITATIONS: carrying, lifting, bending, sitting, standing, squatting, stairs, transfers, and locomotion level  PARTICIPATION  LIMITATIONS: cleaning, laundry, driving, shopping, community activity, and occupation  PERSONAL FACTORS: Fitness and Profession are also affecting patient's functional outcome.   REHAB POTENTIAL: Good  CLINICAL DECISION MAKING: Stable/uncomplicated  EVALUATION COMPLEXITY: Low   GOALS: Goals reviewed with patient? Yes  SHORT TERM GOALS: Target date: 01/14/23 Pt. Will be independent with HEP to increase L knee AROM (0 to >115 deg) to improve pain-free mobility.   Baseline:  -8 to 98 deg. (Pain limited) Goal status: INITIAL   LONG TERM GOALS: Target date: 02/04/23  Pt. Will increase FOTO to 53 to improve pain-free mobility.   Baseline: initial 4 Goal status: INITIAL  2.  Pt. Able to ascend/ descend 5 steps with recip. Pattern and no c/o L knee pain to improve entering house.   Baseline: using ramp at home Goal status: INITIAL  3.  Pt. Will ambulate with normalized gait pattern and recip. Gait pattern/ heel strike to improve return to work.   Baseline: L antalgic gait with RW.  Goal status: INITIAL  PLAN:  PT FREQUENCY: 2x/week  PT DURATION: 6 weeks  PLANNED INTERVENTIONS: 97110-Therapeutic exercises, 97530- Therapeutic activity, 97112- Neuromuscular re-education, 97535- Self Care, 16109- Manual therapy, [phone]- Gait training, [phone]- Electrical stimulation (unattended), Patient/Family education, Balance training, Stair training, Joint mobilization, Scar mobilization, DME instructions, and Cryotherapy  PLAN FOR NEXT SESSION: Focus on quad/hip strengthening and knee ROM  Cammie Mcgee, PT, DPT # [phone] 01/07/2023, 9:37 AM

## 2023-01-10 ENCOUNTER — Ambulatory Visit: Payer: Medicare HMO | Attending: Student | Admitting: Physical Therapy

## 2023-01-10 DIAGNOSIS — M25562 Pain in left knee: Secondary | ICD-10-CM | POA: Insufficient documentation

## 2023-01-10 DIAGNOSIS — M25662 Stiffness of left knee, not elsewhere classified: Secondary | ICD-10-CM | POA: Diagnosis present

## 2023-01-10 DIAGNOSIS — R269 Unspecified abnormalities of gait and mobility: Secondary | ICD-10-CM | POA: Insufficient documentation

## 2023-01-10 DIAGNOSIS — Z96652 Presence of left artificial knee joint: Secondary | ICD-10-CM | POA: Diagnosis present

## 2023-01-10 DIAGNOSIS — M6281 Muscle weakness (generalized): Secondary | ICD-10-CM | POA: Diagnosis present

## 2023-01-10 NOTE — Therapy (Signed)
OUTPATIENT PHYSICAL THERAPY LOWER EXTREMITY TREATMENT   Patient Name: Belinda Frazier MRN: [id number] DOB:02/20/1955, 67 y.o., female Today's Date: 01/10/2023   END OF SESSION:  PT End of Session - 01/10/23 0842     Visit Number 6    Number of Visits 12    Date for PT Re-Evaluation 02/04/23    PT Start Time 0941    PT Stop Time 1032    PT Time Calculation (min) 51 min             Past Medical History:  Diagnosis Date   Anemia    Arthritis    Breast cancer (HCC) left breast   bilateral mastectomy   Chronic kidney disease    stage III   Diabetes mellitus without complication [facility])    Past Surgical History:  Procedure Laterality Date   CESAREAN SECTION  1990   x2 2952,8413   CHOLECYSTECTOMY     2019   MASTECTOMY, RADICAL Bilateral 1996   TOTAL KNEE ARTHROPLASTY Left 12/21/2022   Procedure: TOTAL KNEE ARTHROPLASTY;  Surgeon: Durene Romans, MD;  Location: [facility];  Service: Orthopedics;  Laterality: Left;   Patient Active Problem List   Diagnosis Date Noted   S/P total knee arthroplasty, 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    PCP: Enid Baas, MD  REFERRING PROVIDER: Cassandria Anger, PA-C  REFERRING DIAG: Osteoarthritis of left knee joint  THERAPY DIAG:  Hx of total knee arthroplasty, left  Joint stiffness of knee, left  Muscle weakness (generalized)  Acute pain of left knee  Gait difficulty  Rationale for Evaluation and Treatment: Rehabilitation  ONSET DATE: 12/21/2022  SUBJECTIVE:   SUBJECTIVE STATEMENT: Pt. S/p L TKA on 12/21/22 and reports 8/10 L knee pain.  Pt. Reports chronic h/o L knee pain resulting in TKA.  Pt. Hoping to return to work by early 2025.  Pt. Enjoys traveling and going to beach.    PERTINENT HISTORY: See MD note  PAIN:  Are you having pain? Yes: NPRS scale: 8/10 Pain location: L knee Pain description: deep aching/ sharp Aggravating factors: knee ROM Relieving factors: rest/ ice / meds  PRECAUTIONS: Knee  RED  FLAGS: None   WEIGHT BEARING RESTRICTIONS: No  FALLS:  Has patient fallen in last 6 months? No  LIVING ENVIRONMENT: Lives with: lives with their spouse Lives in: House/apartment Stairs: Yes: External: 5 steps; on right going up  Has ramp in back of house.   Has following equipment at home: Dan Humphreys - 2 wheeled  OCCUPATION: Case Production designer, theatre/television/film for workers Producer, television/film/video.    PLOF: Independent  PATIENT GOALS: Increase L knee ROM/ strength/ return to work  NEXT MD VISIT: 01/05/23  OBJECTIVE:  Note: Objective measures were completed at Evaluation unless otherwise noted.  PATIENT SURVEYS:  FOTO initial 4/ goal 53  COGNITION: Overall cognitive status: Within functional limits for tasks assessed   SENSATION: WFL  EDEMA:  Circumferential: L/R : joint line (38.5/36 cm.), gastroc (33/33 cm.), distal quad (39.5/ 38 cm.).    MUSCLE LENGTH: Hamstrings: Right 80 deg; Left 74 deg  POSTURE: rounded shoulders  PALPATION: Generalized tenderness over L knee/ bandage in place  LOWER EXTREMITY ROM:  Active ROM Right eval Left eval  Hip flexion [facility]  Hip extension    Hip abduction    Hip adduction    Hip internal rotation    Hip external rotation    Knee flexion 141 deg. 98 deg. (Pain)  Knee extension 0 deg. -8 deg. (Pain)  Ankle dorsiflexion [facility]  Ankle plantarflexion [facility] WFL  Ankle inversion    Ankle eversion     (Blank rows = not tested)  LOWER EXTREMITY MMT:  MMT Right eval Left eval  Hip flexion 4 4  Hip extension    Hip abduction 4 4  Hip adduction    Hip internal rotation    Hip external rotation    Knee flexion 5 4-  Knee extension 5 4-  Ankle dorsiflexion    Ankle plantarflexion    Ankle inversion    Ankle eversion     (Blank rows = not tested)  FUNCTIONAL TESTS:  5 times sit to stand: TBD  GAIT: Distance walked: in clinic Assistive device utilized: Walker - 2 wheeled Level of assistance: Modified independence Comments: L antalgic gait with  limited L hip/knee flexion and step pattern.  Increase L knee pain with wt. Bearing and heavy UE assist on RW.    TODAY'S TREATMENT:                                                                                                                              DATE: 01/10/2023  Subjective:  Pt. Reports minimal L medial knee pain and primarily c/o knee stiffness in the morning with walking.  Pt. Entered PT clinic with use of SPC and moderate antalgic gait.  Pt. Reports MD is happy with pts. Progress and pt. Returns for MD f/u 02/11/23.     There.ex.:  Walking in //-bars with less UE assist working on recip. Step pattern/ heel strike 5 laps.  Focus on recip. Step pattern and cuing for more consistent step length/ heel strike. Forward/backwards/ lateral with upright posture.    Walking lunges in //-bars 2 laps with light UE assist.  Good L knee/quad control.    Nustep seat 6-4,  L3 B UE/LE for 10 min.  Warm-up prior to manual stretches.  Discussed weekend activities  TRX: knee flexion 15x2.  Cuing to have equal wt. Bearing on L/R LE.  Pt. Tends to lean to R.   Standing TKE GTB at TM 10x2.  Cuing for proper technique/ quad activiation  Supine L/R hip flexion 10x each.  Supine SAQ/ heel slides 10x each.    Seated LAQ 10x with short holds (pain tolerable range).   Reviewed HEP  Supine L knee AA/PROM (flexion/ extension)- 5x with static holds as tolerated.  Supine L hamstring stretches 3x (guarded).    Supine L knee AROM at end of tx.:  -3 to 115 deg.  (Pain limited).    Pt. Will ice L knee at home today.    PATIENT EDUCATION:  Education details: Access Code: [email or web address] Person educated: Patient Education method: Medical illustrator Education comprehension: verbalized understanding and returned demonstration  HOME EXERCISE PROGRAM: Access Code: [email or web address] URL: [email or web address] Date: 12/27/2022 Prepared by: Dorene Grebe  Exercises - Supine Heel Slide with  Strap  - 2 x daily - 7 x weekly - 2 sets - 10  reps - Active Straight Leg Raise with Quad Set  - 2 x daily - 7 x weekly - 2 sets - 10 reps - Sidelying Hip Abduction  - 2 x daily - 7 x weekly - 2 sets - 10 reps - Supine Quad Set  - 2 x daily - 7 x weekly - 2 sets - 10 reps - Seated Knee Flexion AAROM  - 2 x daily - 7 x weekly - 2 sets - 10 reps - Seated Long Arc Quad  - 2 x daily - 7 x weekly - 2 sets - 10 reps  ASSESSMENT:  CLINICAL IMPRESSION: Pt. Reports minimal L knee pain, mainly muscle stiffness/soreness while walking with use of SPC.  Pt. Instructed to increase L hip/knee flexion and step pattern to decrease antalgic gait pattern/improve heel strike.   Pt. demonstrates good technique with SPC with no balance/ safety issues.  Pt. Progressing well with L knee ROM and demonstrates >114 deg. Flexion (pain limited).  Pt. Limited by generalized muscle fatigue and requires several short rest breaks during there.ex.  Pt. Will continue to benefit from use of ice to manage swelling/ pain after tx.  Pt. Will benefit from skilled PT services to increase L knee ROM/ strength to improve pain-free mobility/ normalized gait.    OBJECTIVE IMPAIRMENTS: Abnormal gait, decreased activity tolerance, decreased balance, decreased endurance, decreased mobility, difficulty walking, decreased ROM, decreased strength, hypomobility, increased edema, impaired flexibility, and pain.   ACTIVITY LIMITATIONS: carrying, lifting, bending, sitting, standing, squatting, stairs, transfers, and locomotion level  PARTICIPATION LIMITATIONS: cleaning, laundry, driving, shopping, community activity, and occupation  PERSONAL FACTORS: Fitness and Profession are also affecting patient's functional outcome.   REHAB POTENTIAL: Good  CLINICAL DECISION MAKING: Stable/uncomplicated  EVALUATION COMPLEXITY: Low   GOALS: Goals reviewed with patient? Yes  SHORT TERM GOALS: Target date: 01/14/23 Pt. Will be independent with HEP to  increase L knee AROM (0 to >115 deg) to improve pain-free mobility.   Baseline:  -8 to 98 deg. (Pain limited) Goal status: INITIAL   LONG TERM GOALS: Target date: 02/04/23  Pt. Will increase FOTO to 53 to improve pain-free mobility.   Baseline: initial 4 Goal status: INITIAL  2.  Pt. Able to ascend/ descend 5 steps with recip. Pattern and no c/o L knee pain to improve entering house.   Baseline: using ramp at home Goal status: INITIAL  3.  Pt. Will ambulate with normalized gait pattern and recip. Gait pattern/ heel strike to improve return to work.   Baseline: L antalgic gait with RW.  Goal status: INITIAL  PLAN:  PT FREQUENCY: 2x/week  PT DURATION: 6 weeks  PLANNED INTERVENTIONS: 97110-Therapeutic exercises, 97530- Therapeutic activity, 97112- Neuromuscular re-education, 97535- Self Care, 29562- Manual therapy, [phone]- Gait training, [phone]- Electrical stimulation (unattended), Patient/Family education, Balance training, Stair training, Joint mobilization, Scar mobilization, DME instructions, and Cryotherapy  PLAN FOR NEXT SESSION: Focus on quad/hip strengthening and knee ROM  Cammie Mcgee, PT, DPT # [id number] 01/10/2023, 10:50 AM

## 2023-01-11 NOTE — Therapy (Signed)
OUTPATIENT PHYSICAL THERAPY LOWER EXTREMITY TREATMENT   Patient Name: Belinda Frazier MRN: [id number] DOB:07/15/1955, 67 y.o., female Today's Date: 01/12/2023   END OF SESSION:  PT End of Session - 01/12/23 0859     Visit Number 7    Number of Visits 12    Date for PT Re-Evaluation 02/04/23    PT Start Time 0900    PT Stop Time 0942    PT Time Calculation (min) 42 min    Activity Tolerance Patient tolerated treatment well    Behavior During Therapy Belinda Frazier for tasks assessed/performed              Past Medical History:  Diagnosis Date   Anemia    Arthritis    Breast cancer (HCC) left breast   bilateral mastectomy   Chronic kidney disease    stage III   Diabetes mellitus without complication [facility])    Past Surgical History:  Procedure Laterality Date   CESAREAN SECTION  1990   x2 [id number]   CHOLECYSTECTOMY     2019   MASTECTOMY, RADICAL Bilateral 1996   TOTAL KNEE ARTHROPLASTY Left 12/21/2022   Procedure: TOTAL KNEE ARTHROPLASTY;  Surgeon: Belinda Romans, MD;  Location: [facility];  Service: Orthopedics;  Laterality: Left;   Patient Active Problem List   Diagnosis Date Noted   S/P total knee arthroplasty, 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    PCP: Belinda Baas, MD  REFERRING PROVIDER: Cassandria Anger, PA-C  REFERRING DIAG: Osteoarthritis of left knee joint  THERAPY DIAG:  Hx of total knee arthroplasty, left  Joint stiffness of knee, left  Muscle weakness (generalized)  Acute pain of left knee  Gait difficulty  Rationale for Evaluation and Treatment: Rehabilitation  ONSET DATE: 12/21/2022  SUBJECTIVE:   SUBJECTIVE STATEMENT: Pt. S/p L TKA on 12/21/22 and reports 8/10 L knee pain.  Pt. Reports chronic h/o L knee pain resulting in TKA.  Pt. Hoping to return to work by early 2025.  Pt. Enjoys traveling and going to beach.    PERTINENT HISTORY: See MD note  PAIN:  Are you having pain? Yes: NPRS scale: 8/10 Pain location: L knee Pain description: deep aching/  sharp Aggravating factors: knee ROM Relieving factors: rest/ ice / meds  PRECAUTIONS: Knee  RED FLAGS: None   WEIGHT BEARING RESTRICTIONS: No  FALLS:  Has patient fallen in last 6 months? No  LIVING ENVIRONMENT: Lives with: lives with their spouse Lives in: House/apartment Stairs: Yes: External: 5 steps; on right going up  Has ramp in back of house.   Has following equipment at home: Dan Humphreys - 2 wheeled  OCCUPATION: Case Production designer, theatre/television/film for workers Producer, television/film/video.    PLOF: Independent  PATIENT GOALS: Increase L knee ROM/ strength/ return to work  NEXT MD VISIT: 01/05/23  OBJECTIVE:  Note: Objective measures were completed at Evaluation unless otherwise noted.  PATIENT SURVEYS:  FOTO initial 4/ goal 68  COGNITION: Overall cognitive status: Within functional limits for tasks assessed   SENSATION: WFL  EDEMA:  Circumferential: L/R : joint line (38.5/36 cm.), gastroc (33/33 cm.), distal quad (39.5/ 38 cm.).    MUSCLE LENGTH: Hamstrings: Right 80 deg; Left 74 deg  POSTURE: rounded shoulders  PALPATION: Generalized tenderness over L knee/ bandage in place  LOWER EXTREMITY ROM:  Active ROM Right eval Left eval  Hip flexion [facility]  Hip extension    Hip abduction    Hip adduction    Hip internal rotation    Hip external rotation  Knee flexion 141 deg. 98 deg. (Pain)  Knee extension 0 deg. -8 deg. (Pain)  Ankle dorsiflexion [facility]  Ankle plantarflexion [facility] WFL  Ankle inversion    Ankle eversion     (Blank rows = not tested)  LOWER EXTREMITY MMT:  MMT Right eval Left eval  Hip flexion 4 4  Hip extension    Hip abduction 4 4  Hip adduction    Hip internal rotation    Hip external rotation    Knee flexion 5 4-  Knee extension 5 4-  Ankle dorsiflexion    Ankle plantarflexion    Ankle inversion    Ankle eversion     (Blank rows = not tested)  FUNCTIONAL TESTS:  5 times sit to stand: TBD  GAIT: Distance walked: in clinic Assistive device  utilized: Belinda Frazier - 2 wheeled Level of assistance: Modified independence Comments: L antalgic gait with limited L hip/knee flexion and step pattern.  Increase L knee pain with wt. Bearing and heavy UE assist on RW.    TODAY'S TREATMENT:                                                                                                                              DATE: 01/12/2023   Subjective:  Patient reports feeling more stiff and achy this morning which may be due to weather. Wanting to learn to do stair training to be able to access front of home.   There.ex.: Nustep seat 6-4,  L3 B UE/LE for 10 min.  Warm-up prior to manual stretches.  Discussed weekend activities  Stair training ascent/descent with step to pattern with single hand rail 2 x 4 steps   Standing knee flexion stretch on 2nd step 10 x 10 second hold   TRX: knee flexion 15x2.  Cuing to have equal wt. Bearing on L/R LE.  Pt. Tends to lean to R.   Sit to stand with R foot on airex pad x 10 with no UE support to emphasize use of L LE   Supine heel slides x 8 on L   Supine quad sets x 10 on L   Seated LAQ 10x with short holds (pain tolerable range).   Supine L knee AROM at end of tx.:  -2 to 118 deg.  (Pain limited).    Pt. Will ice L knee at home today.    Not today: -Supine L knee AA/PROM (flexion/ extension)- 5x with static holds as tolerated.   -Standing TKE GTB at TM 10x2.  Cuing for proper technique/ quad activiation -Walking in //-bars with less UE assist working on recip. Step pattern/ heel strike 5 laps.  Focus on recip. Step pattern and cuing for more consistent step length/ heel strike. Forward/backwards/ lateral with upright posture.   -Walking lunges in //-bars 2 laps with light UE assist.  Good L knee/quad control.   PATIENT EDUCATION:  Education details: Access Code: [email or web address] Person educated: Patient Education method: Explanation  and Demonstration Education comprehension: verbalized understanding and  returned demonstration  HOME EXERCISE PROGRAM: Access Code: [email or web address] URL: [email or web address] Date: 12/27/2022 Prepared by: Dorene Grebe  Exercises - Supine Heel Slide with Strap  - 2 x daily - 7 x weekly - 2 sets - 10 reps - Active Straight Leg Raise with Quad Set  - 2 x daily - 7 x weekly - 2 sets - 10 reps - Sidelying Hip Abduction  - 2 x daily - 7 x weekly - 2 sets - 10 reps - Supine Quad Set  - 2 x daily - 7 x weekly - 2 sets - 10 reps - Seated Knee Flexion AAROM  - 2 x daily - 7 x weekly - 2 sets - 10 reps - Seated Long Arc Quad  - 2 x daily - 7 x weekly - 2 sets - 10 reps  ASSESSMENT:  CLINICAL IMPRESSION:   Patient arrives to treatment session motivated but complaining of stiffness and achy pain. Session focused on L quad strengthening and improving L knee ROM. Tolerated treatment session well. Patient will benefit from skilled PT services to increase L knee ROM/ strength to improve pain-free mobility/ normalized gait.    OBJECTIVE IMPAIRMENTS: Abnormal gait, decreased activity tolerance, decreased balance, decreased endurance, decreased mobility, difficulty walking, decreased ROM, decreased strength, hypomobility, increased edema, impaired flexibility, and pain.   ACTIVITY LIMITATIONS: carrying, lifting, bending, sitting, standing, squatting, stairs, transfers, and locomotion level  PARTICIPATION LIMITATIONS: cleaning, laundry, driving, shopping, community activity, and occupation  PERSONAL FACTORS: Fitness and Profession are also affecting patient's functional outcome.   REHAB POTENTIAL: Good  CLINICAL DECISION MAKING: Stable/uncomplicated  EVALUATION COMPLEXITY: Low   GOALS: Goals reviewed with patient? Yes  SHORT TERM GOALS: Target date: 01/14/23 Pt. Will be independent with HEP to increase L knee AROM (0 to >115 deg) to improve pain-free mobility.   Baseline:  -8 to 98 deg. (Pain limited) Goal status: INITIAL   LONG TERM GOALS: Target date:  02/04/23  Pt. Will increase FOTO to 53 to improve pain-free mobility.   Baseline: initial 4 Goal status: INITIAL  2.  Pt. Able to ascend/ descend 5 steps with recip. Pattern and no c/o L knee pain to improve entering house.   Baseline: using ramp at home Goal status: INITIAL  3.  Pt. Will ambulate with normalized gait pattern and recip. Gait pattern/ heel strike to improve return to work.   Baseline: L antalgic gait with RW.  Goal status: INITIAL  PLAN:  PT FREQUENCY: 2x/week  PT DURATION: 6 weeks  PLANNED INTERVENTIONS: 97110-Therapeutic exercises, 97530- Therapeutic activity, 97112- Neuromuscular re-education, 97535- Self Care, [id number]- Manual therapy, [phone]- Gait training, [phone]- Electrical stimulation (unattended), Patient/Family education, Balance training, Stair training, Joint mobilization, Scar mobilization, DME instructions, and Cryotherapy  PLAN FOR NEXT SESSION: Focus on quad/hip strengthening and knee ROM  Maylon Peppers, PT, DPT Physical Therapist - [facility]  01/12/2023, 9:00 AM

## 2023-01-12 ENCOUNTER — Ambulatory Visit: Payer: Medicare HMO

## 2023-01-12 ENCOUNTER — Encounter: Payer: Self-pay | Admitting: Physical Therapy

## 2023-01-12 DIAGNOSIS — Z96652 Presence of left artificial knee joint: Secondary | ICD-10-CM | POA: Diagnosis not present

## 2023-01-12 DIAGNOSIS — M25562 Pain in left knee: Secondary | ICD-10-CM

## 2023-01-12 DIAGNOSIS — M25662 Stiffness of left knee, not elsewhere classified: Secondary | ICD-10-CM

## 2023-01-12 DIAGNOSIS — R269 Unspecified abnormalities of gait and mobility: Secondary | ICD-10-CM

## 2023-01-12 DIAGNOSIS — M6281 Muscle weakness (generalized): Secondary | ICD-10-CM

## 2023-01-17 ENCOUNTER — Ambulatory Visit: Payer: Medicare HMO | Admitting: Physical Therapy

## 2023-01-17 ENCOUNTER — Encounter: Payer: Self-pay | Admitting: Physical Therapy

## 2023-01-17 DIAGNOSIS — Z96652 Presence of left artificial knee joint: Secondary | ICD-10-CM

## 2023-01-17 DIAGNOSIS — M25662 Stiffness of left knee, not elsewhere classified: Secondary | ICD-10-CM

## 2023-01-17 DIAGNOSIS — M6281 Muscle weakness (generalized): Secondary | ICD-10-CM

## 2023-01-17 DIAGNOSIS — M25562 Pain in left knee: Secondary | ICD-10-CM

## 2023-01-17 DIAGNOSIS — R269 Unspecified abnormalities of gait and mobility: Secondary | ICD-10-CM

## 2023-01-17 NOTE — Therapy (Signed)
OUTPATIENT PHYSICAL THERAPY LOWER EXTREMITY TREATMENT   Patient Name: Belinda Frazier MRN: [id number] DOB:03-05-1955, 67 y.o., female Today's Date: 01/17/2023   END OF SESSION:  PT End of Session - 01/17/23 0945     Visit Number 8    Number of Visits 12    Date for PT Re-Evaluation 02/04/23    PT Start Time 0939    PT Stop Time 1029    PT Time Calculation (min) 50 min    Activity Tolerance Patient tolerated treatment well    Behavior During Therapy [facility] for tasks assessed/performed              Past Medical History:  Diagnosis Date   Anemia    Arthritis    Breast cancer (HCC) left breast   bilateral mastectomy   Chronic kidney disease    stage III   Diabetes mellitus without complication [facility])    Past Surgical History:  Procedure Laterality Date   CESAREAN SECTION  1990   x2 9629,5284   CHOLECYSTECTOMY     2019   MASTECTOMY, RADICAL Bilateral 1996   TOTAL KNEE ARTHROPLASTY Left 12/21/2022   Procedure: TOTAL KNEE ARTHROPLASTY;  Surgeon: Durene Romans, MD;  Location: [facility];  Service: Orthopedics;  Laterality: Left;   Patient Active Problem List   Diagnosis Date Noted   S/P total knee arthroplasty, 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    PCP: Enid Baas, MD  REFERRING PROVIDER: Cassandria Anger, PA-C  REFERRING DIAG: Osteoarthritis of left knee joint  THERAPY DIAG:  Hx of total knee arthroplasty, left  Joint stiffness of knee, left  Muscle weakness (generalized)  Acute pain of left knee  Gait difficulty  Rationale for Evaluation and Treatment: Rehabilitation  ONSET DATE: 12/21/2022  SUBJECTIVE:   SUBJECTIVE STATEMENT: Pt. S/p L TKA on 12/21/22 and reports 8/10 L knee pain.  Pt. Reports chronic h/o L knee pain resulting in TKA.  Pt. Hoping to return to work by early 2025.  Pt. Enjoys traveling and going to beach.    PERTINENT HISTORY: See MD note  PAIN:  Are you having pain? Yes: NPRS scale: 8/10 Pain location: L knee Pain description: deep aching/  sharp Aggravating factors: knee ROM Relieving factors: rest/ ice / meds  PRECAUTIONS: Knee  RED FLAGS: None   WEIGHT BEARING RESTRICTIONS: No  FALLS:  Has patient fallen in last 6 months? No  LIVING ENVIRONMENT: Lives with: lives with their spouse Lives in: House/apartment Stairs: Yes: External: 5 steps; on right going up  Has ramp in back of house.   Has following equipment at home: Dan Humphreys - 2 wheeled  OCCUPATION: Case Production designer, theatre/television/film for workers Producer, television/film/video.    PLOF: Independent  PATIENT GOALS: Increase L knee ROM/ strength/ return to work  NEXT MD VISIT: 01/05/23  OBJECTIVE:  Note: Objective measures were completed at Evaluation unless otherwise noted.  PATIENT SURVEYS:  FOTO initial 4/ goal 51  COGNITION: Overall cognitive status: Within functional limits for tasks assessed   SENSATION: WFL  EDEMA:  Circumferential: L/R : joint line (38.5/36 cm.), gastroc (33/33 cm.), distal quad (39.5/ 38 cm.).    MUSCLE LENGTH: Hamstrings: Right 80 deg; Left 74 deg  POSTURE: rounded shoulders  PALPATION: Generalized tenderness over L knee/ bandage in place  LOWER EXTREMITY ROM:  Active ROM Right eval Left eval  Hip flexion [facility]  Hip extension    Hip abduction    Hip adduction    Hip internal rotation    Hip external rotation  Knee flexion 141 deg. 98 deg. (Pain)  Knee extension 0 deg. -8 deg. (Pain)  Ankle dorsiflexion [facility]  Ankle plantarflexion [facility] WFL  Ankle inversion    Ankle eversion     (Blank rows = not tested)  LOWER EXTREMITY MMT:  MMT Right eval Left eval  Hip flexion 4 4  Hip extension    Hip abduction 4 4  Hip adduction    Hip internal rotation    Hip external rotation    Knee flexion 5 4-  Knee extension 5 4-  Ankle dorsiflexion    Ankle plantarflexion    Ankle inversion    Ankle eversion     (Blank rows = not tested)  FUNCTIONAL TESTS:  5 times sit to stand: TBD  GAIT: Distance walked: in clinic Assistive device  utilized: Walker - 2 wheeled Level of assistance: Modified independence Comments: L antalgic gait with limited L hip/knee flexion and step pattern.  Increase L knee pain with wt. Bearing and heavy UE assist on RW.    TODAY'S TREATMENT:                                                                                                                              DATE: 01/17/2023   Subjective:  Patient reports increase L knee stiffness/ discomfort last Saturday night.  Pt. States she was active on Friday/Saturday.  Pt. States the stiffness is slightly better today but still ambulates with antalgic gait and limited L knee extension.  Pt. Still using SPC and trying to wean off.     There.ex.: Nustep seat 6-4,  L2 B UE/LE for 10 min.  Warm-up prior to manual stretches.  Discussed weekend activities.  Stair training ascending/ descending with recip. pattern with single hand rail 4 x 4 steps.  Pt. Reports 6/10 L lateral knee pain.    Seated LAQ 3#: 10x2 with short holds (pain tolerable range).   Seated marching 3#: 10x2 with short holds.    TG knee flexion 20x/ heel raises 20x.  Focus on L knee extension/ quad control.    Supine L knee AROM at end of tx.:  -3 to 118 deg.  (Pain limited).    Manual:   Scar massage: crepitus noted at proximal/distal aspects of incision.  PT instructed pt. To performs scar massage at home/ in shower.    Supine L knee AA/PROM flexion and extension (focus on ext.).      Pt. Will ice L knee at home today.     PATIENT EDUCATION:  Education details: Access Code: [email or web address] Person educated: Patient Education method: Medical illustrator Education comprehension: verbalized understanding and returned demonstration  HOME EXERCISE PROGRAM: Access Code: [email or web address] URL: [email or web address] Date: 12/27/2022 Prepared by: Dorene Grebe  Exercises - Supine Heel Slide with Strap  - 2 x daily - 7 x weekly - 2 sets - 10 reps - Active Straight Leg  Raise with Quad Set  - 2 x daily -  7 x weekly - 2 sets - 10 reps - Sidelying Hip Abduction  - 2 x daily - 7 x weekly - 2 sets - 10 reps - Supine Quad Set  - 2 x daily - 7 x weekly - 2 sets - 10 reps - Seated Knee Flexion AAROM  - 2 x daily - 7 x weekly - 2 sets - 10 reps - Seated Long Arc Quad  - 2 x daily - 7 x weekly - 2 sets - 10 reps  ASSESSMENT:  CLINICAL IMPRESSION:   Patient arrives to treatment session motivated but complaining of stiffness and achy pain. Session focused on L quad strengthening and improving L knee ROM. Tolerated treatment session well.  Pt. Continues to ambulate with moderate L antalgic gait while carrying SPC.  PT cued pt. On importance of L heel strike/ knee extension during stance phase of gait.  Patient will benefit from skilled PT services to increase L knee ROM/ strength to improve pain-free mobility/ normalized gait.    OBJECTIVE IMPAIRMENTS: Abnormal gait, decreased activity tolerance, decreased balance, decreased endurance, decreased mobility, difficulty walking, decreased ROM, decreased strength, hypomobility, increased edema, impaired flexibility, and pain.   ACTIVITY LIMITATIONS: carrying, lifting, bending, sitting, standing, squatting, stairs, transfers, and locomotion level  PARTICIPATION LIMITATIONS: cleaning, laundry, driving, shopping, community activity, and occupation  PERSONAL FACTORS: Fitness and Profession are also affecting patient's functional outcome.   REHAB POTENTIAL: Good  CLINICAL DECISION MAKING: Stable/uncomplicated  EVALUATION COMPLEXITY: Low   GOALS: Goals reviewed with patient? Yes  SHORT TERM GOALS: Target date: 01/14/23 Pt. Will be independent with HEP to increase L knee AROM (0 to >115 deg) to improve pain-free mobility.   Baseline:  -8 to 98 deg. (Pain limited) Goal status: Partially met   LONG TERM GOALS: Target date: 02/04/23  Pt. Will increase FOTO to 53 to improve pain-free mobility.   Baseline: initial 4 Goal  status: INITIAL  2.  Pt. Able to ascend/ descend 5 steps with recip. Pattern and no c/o L knee pain to improve entering house.   Baseline: using ramp at home Goal status: INITIAL  3.  Pt. Will ambulate with normalized gait pattern and recip. Gait pattern/ heel strike to improve return to work.   Baseline: L antalgic gait with RW.  Goal status: INITIAL  PLAN:  PT FREQUENCY: 2x/week  PT DURATION: 6 weeks  PLANNED INTERVENTIONS: 97110-Therapeutic exercises, 97530- Therapeutic activity, 97112- Neuromuscular re-education, 97535- Self Care, 41324- Manual therapy, [phone]- Gait training, [phone]- Electrical stimulation (unattended), Patient/Family education, Balance training, Stair training, Joint mobilization, Scar mobilization, DME instructions, and Cryotherapy  PLAN FOR NEXT SESSION: Focus on quad/hip strengthening and knee ROM  Cammie Mcgee, PT, DPT # [phone] Physical Therapist - [facility]  01/17/2023, 12:42 PM

## 2023-01-19 ENCOUNTER — Ambulatory Visit: Payer: Medicare HMO | Admitting: Physical Therapy

## 2023-01-24 ENCOUNTER — Ambulatory Visit: Payer: Medicare HMO | Admitting: Physical Therapy

## 2023-01-24 DIAGNOSIS — M25562 Pain in left knee: Secondary | ICD-10-CM

## 2023-01-24 DIAGNOSIS — R269 Unspecified abnormalities of gait and mobility: Secondary | ICD-10-CM

## 2023-01-24 DIAGNOSIS — Z96652 Presence of left artificial knee joint: Secondary | ICD-10-CM | POA: Diagnosis not present

## 2023-01-24 DIAGNOSIS — M25662 Stiffness of left knee, not elsewhere classified: Secondary | ICD-10-CM

## 2023-01-24 DIAGNOSIS — M6281 Muscle weakness (generalized): Secondary | ICD-10-CM

## 2023-01-24 NOTE — Therapy (Signed)
OUTPATIENT PHYSICAL THERAPY LOWER EXTREMITY TREATMENT   Patient Name: Belinda Frazier MRN: [id number] DOB:[birth year], 67 y.o., female Today's Date: 01/24/2023  END OF SESSION:  PT End of Session - 01/24/23 0948     Visit Number 9    Number of Visits 12    Date for PT Re-Evaluation 02/04/23    PT Start Time 0944    PT Stop Time 1033    PT Time Calculation (min) 49 min    Activity Tolerance Patient tolerated treatment well    Behavior During Therapy [facility] for tasks assessed/performed            Past Medical History:  Diagnosis Date   Anemia    Arthritis    Breast cancer (HCC) left breast   bilateral mastectomy   Chronic kidney disease    stage III   Diabetes mellitus without complication [facility])    Past Surgical History:  Procedure Laterality Date   CESAREAN SECTION  1990   x2 [id number]   CHOLECYSTECTOMY     2019   MASTECTOMY, RADICAL Bilateral 1996   TOTAL KNEE ARTHROPLASTY Left 12/21/2022   Procedure: TOTAL KNEE ARTHROPLASTY;  Surgeon: Durene Romans, MD;  Location: [facility];  Service: Orthopedics;  Laterality: Left;   Patient Active Problem List   Diagnosis Date Noted   S/P total knee arthroplasty, 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    PCP: Enid Baas, MD  REFERRING PROVIDER: Cassandria Anger, PA-C  REFERRING DIAG: Osteoarthritis of left knee joint  THERAPY DIAG:  Hx of total knee arthroplasty, left  Joint stiffness of knee, left  Muscle weakness (generalized)  Acute pain of left knee  Gait difficulty  Rationale for Evaluation and Treatment: Rehabilitation  ONSET DATE: 12/21/2022  SUBJECTIVE:   SUBJECTIVE STATEMENT: Pt. S/p L TKA on 12/21/22 and reports 8/10 L knee pain.  Pt. Reports chronic h/o L knee pain resulting in TKA.  Pt. Hoping to return to work by early 2025.  Pt. Enjoys traveling and going to beach.    PERTINENT HISTORY: See MD note  PAIN:  Are you having pain? Yes: NPRS scale: 8/10 Pain location: L knee Pain description: deep aching/  sharp Aggravating factors: knee ROM Relieving factors: rest/ ice / meds  PRECAUTIONS: Knee  RED FLAGS: None   WEIGHT BEARING RESTRICTIONS: No  FALLS:  Has patient fallen in last 6 months? No  LIVING ENVIRONMENT: Lives with: lives with their spouse Lives in: House/apartment Stairs: Yes: External: 5 steps; on right going up  Has ramp in back of house.   Has following equipment at home: Dan Humphreys - 2 wheeled  OCCUPATION: Case Production designer, theatre/television/film for workers Producer, television/film/video.    PLOF: Independent  PATIENT GOALS: Increase L knee ROM/ strength/ return to work  NEXT MD VISIT: 01/05/23  OBJECTIVE:  Note: Objective measures were completed at Evaluation unless otherwise noted.  PATIENT SURVEYS:  FOTO initial 4/ goal 72  COGNITION: Overall cognitive status: Within functional limits for tasks assessed   SENSATION: WFL  EDEMA:  Circumferential: L/R : joint line (38.5/36 cm.), gastroc (33/33 cm.), distal quad (39.5/ 38 cm.).    MUSCLE LENGTH: Hamstrings: Right 80 deg; Left 74 deg  POSTURE: rounded shoulders  PALPATION: Generalized tenderness over L knee/ bandage in place  LOWER EXTREMITY ROM:  Active ROM Right eval Left eval  Hip flexion [facility]  Hip extension    Hip abduction    Hip adduction    Hip internal rotation    Hip external rotation    Knee flexion 141  deg. 98 deg. (Pain)  Knee extension 0 deg. -8 deg. (Pain)  Ankle dorsiflexion [facility]  Ankle plantarflexion [facility] WFL  Ankle inversion    Ankle eversion     (Blank rows = not tested)  LOWER EXTREMITY MMT:  MMT Right eval Left eval  Hip flexion 4 4  Hip extension    Hip abduction 4 4  Hip adduction    Hip internal rotation    Hip external rotation    Knee flexion 5 4-  Knee extension 5 4-  Ankle dorsiflexion    Ankle plantarflexion    Ankle inversion    Ankle eversion     (Blank rows = not tested)  FUNCTIONAL TESTS:  5 times sit to stand: TBD  GAIT: Distance walked: in clinic Assistive device  utilized: Walker - 2 wheeled Level of assistance: Modified independence Comments: L antalgic gait with limited L hip/knee flexion and step pattern.  Increase L knee pain with wt. Bearing and heavy UE assist on RW.    TODAY'S TREATMENT:                                                                                                                              DATE: 01/24/2023   Subjective:  Pt. Returns to work on 02/07/23 full-time.  Pt. Cancelled last PT appt. Secondary to L knee pain and returned to MD for reassessment.  Pt. put on Prednisone for 12 days to manage inflammation.  Pt. States x-ray of L knee was good/ no issues.  Pt. Carrying SPC into PT clinic with slight L antalgic gait pattern.    There.ex.: Nustep L4 B UE/LE for 10 min.  Warm-up prior to manual stretches.  Discussed weekend activities/ MD.    Standing 3# ankle wt.:  forward/lateral 2 laps each.  Marching/ hamstring curls/ heel raises 20x each.     Supine 3# SAQ with bolster/ hip and knee flexion to 90 deg. 10x2.    Ascending/ descending stairs with recip. pattern with single hand rail 4 x 4 steps.  Pt. Reports 4/10 L lateral knee pain.  Good eccentric L knee control.      Walking in clinic/ outside on grass without assistive device with consistent recip. Gait pattern.  Pt. Able to step up on curb with no issues and good L knee/ quad control.  Pt. More hesitant with walking on grass as compared to level surfaces.    Supine L knee AROM at end of tx.:  -2 to 119 deg.  (Pain limited).    Manual: No charge  Reassessment of scar/ good healing noted.  Instructed pt. To use vitamin E lotion.     Supine L knee AA/PROM flexion and extension (focus on ext.).      Pt. Will ice L knee at home today.     PATIENT EDUCATION:  Education details: Access Code: [email or web address] Person educated: Patient Education method: Medical illustrator Education comprehension: verbalized understanding and returned  demonstration  HOME EXERCISE  PROGRAM: Access Code: [email or web address] URL: [email or web address] Date: 12/27/2022 Prepared by: Dorene Grebe  Exercises - Supine Heel Slide with Strap  - 2 x daily - 7 x weekly - 2 sets - 10 reps - Active Straight Leg Raise with Quad Set  - 2 x daily - 7 x weekly - 2 sets - 10 reps - Sidelying Hip Abduction  - 2 x daily - 7 x weekly - 2 sets - 10 reps - Supine Quad Set  - 2 x daily - 7 x weekly - 2 sets - 10 reps - Seated Knee Flexion AAROM  - 2 x daily - 7 x weekly - 2 sets - 10 reps - Seated Long Arc Quad  - 2 x daily - 7 x weekly - 2 sets - 10 reps  ASSESSMENT:  CLINICAL IMPRESSION:   Patient arrives to treatment session motivated but complaining of stiffness and achy L medial/ lateral knee pain. Session focused on L quad strengthening and improving L knee ROM. Tolerated treatment session well.  Pt. Continues to ambulate with moderate L antalgic gait with initial steps after standing but improved recip. Pattern with increase distance walked.  PT cued pt. On importance of L heel strike/ knee extension during stance phase of gait.  Good incision healing.  Patient will benefit from skilled PT services to increase L knee ROM/ strength to improve pain-free mobility/ normalized gait.    OBJECTIVE IMPAIRMENTS: Abnormal gait, decreased activity tolerance, decreased balance, decreased endurance, decreased mobility, difficulty walking, decreased ROM, decreased strength, hypomobility, increased edema, impaired flexibility, and pain.   ACTIVITY LIMITATIONS: carrying, lifting, bending, sitting, standing, squatting, stairs, transfers, and locomotion level  PARTICIPATION LIMITATIONS: cleaning, laundry, driving, shopping, community activity, and occupation  PERSONAL FACTORS: Fitness and Profession are also affecting patient's functional outcome.   REHAB POTENTIAL: Good  CLINICAL DECISION MAKING: Stable/uncomplicated  EVALUATION COMPLEXITY: Low   GOALS: Goals reviewed with patient?  Yes  SHORT TERM GOALS: Target date: 01/14/23 Pt. Will be independent with HEP to increase L knee AROM (0 to >115 deg) to improve pain-free mobility.   Baseline:  -8 to 98 deg. (Pain limited) Goal status: Partially met   LONG TERM GOALS: Target date: 02/04/23  Pt. Will increase FOTO to 53 to improve pain-free mobility.   Baseline: initial 4 Goal status: INITIAL  2.  Pt. Able to ascend/ descend 5 steps with recip. Pattern and no c/o L knee pain to improve entering house.   Baseline: using ramp at home Goal status: INITIAL  3.  Pt. Will ambulate with normalized gait pattern and recip. Gait pattern/ heel strike to improve return to work.   Baseline: L antalgic gait with RW.  Goal status: INITIAL  PLAN:  PT FREQUENCY: 2x/week  PT DURATION: 6 weeks  PLANNED INTERVENTIONS: 97110-Therapeutic exercises, 97530- Therapeutic activity, 97112- Neuromuscular re-education, 97535- Self Care, 11914- Manual therapy, [phone]- Gait training, [phone]- Electrical stimulation (unattended), Patient/Family education, Balance training, Stair training, Joint mobilization, Scar mobilization, DME instructions, and Cryotherapy  PLAN FOR NEXT SESSION: Focus on quad/hip strengthening and knee ROM.  CHECK FOTO  Cammie Mcgee, PT, DPT # [phone] Physical Therapist - [facility]  01/24/2023, 10:53 AM

## 2023-01-27 ENCOUNTER — Ambulatory Visit: Payer: Medicare HMO | Admitting: Physical Therapy

## 2023-01-27 ENCOUNTER — Encounter: Payer: Self-pay | Admitting: Physical Therapy

## 2023-01-27 DIAGNOSIS — M6281 Muscle weakness (generalized): Secondary | ICD-10-CM

## 2023-01-27 DIAGNOSIS — Z96652 Presence of left artificial knee joint: Secondary | ICD-10-CM

## 2023-01-27 DIAGNOSIS — M25562 Pain in left knee: Secondary | ICD-10-CM

## 2023-01-27 DIAGNOSIS — R269 Unspecified abnormalities of gait and mobility: Secondary | ICD-10-CM

## 2023-01-27 DIAGNOSIS — M25662 Stiffness of left knee, not elsewhere classified: Secondary | ICD-10-CM

## 2023-01-27 NOTE — Therapy (Signed)
OUTPATIENT PHYSICAL THERAPY LOWER EXTREMITY TREATMENT Physical Therapy Progress Note  Dates of reporting period  12/24/22   to   01/27/23   Patient Name: Belinda Frazier MRN: [id number] DOB:1955/10/31, 67 y.o., female Today's Date: 01/27/2023  END OF SESSION:  PT End of Session - 01/27/23 0953     Visit Number 10    Number of Visits 12    Date for PT Re-Evaluation 02/04/23    PT Start Time 0953    PT Stop Time 1035    PT Time Calculation (min) 42 min    Activity Tolerance Patient tolerated treatment well    Behavior During Therapy [facility] for tasks assessed/performed            Past Medical History:  Diagnosis Date   Anemia    Arthritis    Breast cancer (HCC) left breast   bilateral mastectomy   Chronic kidney disease    stage III   Diabetes mellitus without complication [facility])    Past Surgical History:  Procedure Laterality Date   CESAREAN SECTION  1990   x2 4132,4401   CHOLECYSTECTOMY     2019   MASTECTOMY, RADICAL Bilateral 1996   TOTAL KNEE ARTHROPLASTY Left 12/21/2022   Procedure: TOTAL KNEE ARTHROPLASTY;  Surgeon: Durene Romans, MD;  Location: [facility];  Service: Orthopedics;  Laterality: Left;   Patient Active Problem List   Diagnosis Date Noted   S/P total knee arthroplasty, 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    PCP: Enid Baas, MD  REFERRING PROVIDER: Cassandria Anger, PA-C  REFERRING DIAG: Osteoarthritis of left knee joint  THERAPY DIAG:  Hx of total knee arthroplasty, left  Joint stiffness of knee, left  Muscle weakness (generalized)  Acute pain of left knee  Gait difficulty  Rationale for Evaluation and Treatment: Rehabilitation  ONSET DATE: 12/21/2022  SUBJECTIVE:   SUBJECTIVE STATEMENT: Pt. S/p L TKA on 12/21/22 and reports 8/10 L knee pain.  Pt. Reports chronic h/o L knee pain resulting in TKA.  Pt. Hoping to return to work by early 2025.  Pt. Enjoys traveling and going to beach.    PERTINENT HISTORY: See MD note  PAIN:  Are you having  pain? Yes: NPRS scale: 8/10 Pain location: L knee Pain description: deep aching/ sharp Aggravating factors: knee ROM Relieving factors: rest/ ice / meds  PRECAUTIONS: Knee  RED FLAGS: None   WEIGHT BEARING RESTRICTIONS: No  FALLS:  Has patient fallen in last 6 months? No  LIVING ENVIRONMENT: Lives with: lives with their spouse Lives in: House/apartment Stairs: Yes: External: 5 steps; on right going up  Has ramp in back of house.   Has following equipment at home: Dan Humphreys - 2 wheeled  OCCUPATION: Case Production designer, theatre/television/film for workers Producer, television/film/video.    PLOF: Independent  PATIENT GOALS: Increase L knee ROM/ strength/ return to work  NEXT MD VISIT: 01/05/23  OBJECTIVE:  Note: Objective measures were completed at Evaluation unless otherwise noted.  PATIENT SURVEYS:  FOTO initial 4/ goal 17  COGNITION: Overall cognitive status: Within functional limits for tasks assessed   SENSATION: WFL  EDEMA:  Circumferential: L/R : joint line (38.5/36 cm.), gastroc (33/33 cm.), distal quad (39.5/ 38 cm.).    MUSCLE LENGTH: Hamstrings: Right 80 deg; Left 74 deg  POSTURE: rounded shoulders  PALPATION: Generalized tenderness over L knee/ bandage in place  LOWER EXTREMITY ROM:  Active ROM Right eval Left eval  Hip flexion [facility]  Hip extension    Hip abduction    Hip adduction  Hip internal rotation    Hip external rotation    Knee flexion 141 deg. 98 deg. (Pain)  Knee extension 0 deg. -8 deg. (Pain)  Ankle dorsiflexion [facility]  Ankle plantarflexion [facility] WFL  Ankle inversion    Ankle eversion     (Blank rows = not tested)  LOWER EXTREMITY MMT:  MMT Right eval Left eval  Hip flexion 4 4  Hip extension    Hip abduction 4 4  Hip adduction    Hip internal rotation    Hip external rotation    Knee flexion 5 4-  Knee extension 5 4-  Ankle dorsiflexion    Ankle plantarflexion    Ankle inversion    Ankle eversion     (Blank rows = not tested)  FUNCTIONAL TESTS:  5  times sit to stand: TBD  GAIT: Distance walked: in clinic Assistive device utilized: Walker - 2 wheeled Level of assistance: Modified independence Comments: L antalgic gait with limited L hip/knee flexion and step pattern.  Increase L knee pain with wt. Bearing and heavy UE assist on RW.    TODAY'S TREATMENT:                                                                                                                              DATE: 01/27/2023   Subjective:  Pt. Returns to work on 02/07/23 full-time.  Pt. Carrying SPC into PT clinic with slight L antalgic gait pattern.  Pt. Reports increase L foot swelling as day progresses.  PT discussed ankle pumps and use of compression stockings, esp. With upcoming car ride to [place].    There.ex.: Nustep L4 B UE/LE for 10 min.  Warm-up prior to manual stretches.  Discussed weekend activities/ ankle swelling.    2nd step L knee flexion/ hamstring stretches with light UE assist on handrails for balance.    Supine SLR/ heel slides/ quad sets/ bridging 20x.  Supine L hamstring stretches 3x (pain in anterior knee with OP).    Walking in clinic without assistive device with consistent recip. Gait pattern.  Pt. Ambulates with improved gait pattern with increase cadence.   Supine L knee AROM at end of tx.:  -2 to 119 deg.  (Pain limited).   Assessment of L/R ankle swelling: figure 8 measurement (47/ 47.5 cm).  No swelling noted at this time.  Pt. Instructed on use of compression stockings with upcoming car trip.     Manual: No charge  Reassessment of scar/ good healing noted.      Pt. Will ice L knee at home today.     PATIENT EDUCATION:  Education details: Access Code: [email or web address] Person educated: Patient Education method: Medical illustrator Education comprehension: verbalized understanding and returned demonstration  HOME EXERCISE PROGRAM: Access Code: [email or web address] URL: [email or web address] Date:  12/27/2022 Prepared by: Dorene Grebe  Exercises - Supine Heel Slide with Strap  - 2 x daily - 7 x weekly - 2 sets -  10 reps - Active Straight Leg Raise with Quad Set  - 2 x daily - 7 x weekly - 2 sets - 10 reps - Sidelying Hip Abduction  - 2 x daily - 7 x weekly - 2 sets - 10 reps - Supine Quad Set  - 2 x daily - 7 x weekly - 2 sets - 10 reps - Seated Knee Flexion AAROM  - 2 x daily - 7 x weekly - 2 sets - 10 reps - Seated Long Arc Quad  - 2 x daily - 7 x weekly - 2 sets - 10 reps  ASSESSMENT:  CLINICAL IMPRESSION:   Patient arrives to treatment session motivated but L ankle/foot swelling.  Session focused on L quad strengthening and improving L knee ROM. Tolerated treatment session well.  Pt. Continues to ambulate with moderate L antalgic gait with initial steps after standing but improved recip. Pattern with increase distance walked/ cadence.  PT cued pt. On importance of L heel strike/ knee extension during stance phase of gait.  Good incision healing.  Patient will benefit from skilled PT services to increase L knee ROM/ strength to improve pain-free mobility/ normalized gait.    OBJECTIVE IMPAIRMENTS: Abnormal gait, decreased activity tolerance, decreased balance, decreased endurance, decreased mobility, difficulty walking, decreased ROM, decreased strength, hypomobility, increased edema, impaired flexibility, and pain.   ACTIVITY LIMITATIONS: carrying, lifting, bending, sitting, standing, squatting, stairs, transfers, and locomotion level  PARTICIPATION LIMITATIONS: cleaning, laundry, driving, shopping, community activity, and occupation  PERSONAL FACTORS: Fitness and Profession are also affecting patient's functional outcome.   REHAB POTENTIAL: Good  CLINICAL DECISION MAKING: Stable/uncomplicated  EVALUATION COMPLEXITY: Low   GOALS: Goals reviewed with patient? Yes  SHORT TERM GOALS: Target date: 01/14/23 Pt. Will be independent with HEP to increase L knee AROM (0 to >115  deg) to improve pain-free mobility.   Baseline:  -8 to 98 deg. (Pain limited) Goal status: Partially met   LONG TERM GOALS: Target date: 02/04/23  Pt. Will increase FOTO to 53 to improve pain-free mobility.   Baseline: initial 4 Goal status: On-going  2.  Pt. Able to ascend/ descend 5 steps with recip. Pattern and no c/o L knee pain to improve entering house.   Baseline: using ramp at home Goal status: Partially met  3.  Pt. Will ambulate with normalized gait pattern and recip. Gait pattern/ heel strike to improve return to work.   Baseline: L antalgic gait with RW.  Goal status: Partially met  PLAN:  PT FREQUENCY: 2x/week  PT DURATION: 6 weeks  PLANNED INTERVENTIONS: 97110-Therapeutic exercises, 97530- Therapeutic activity, 97112- Neuromuscular re-education, 97535- Self Care, 96045- Manual therapy, [phone]- Gait training, [phone]- Electrical stimulation (unattended), Patient/Family education, Balance training, Stair training, Joint mobilization, Scar mobilization, DME instructions, and Cryotherapy  PLAN FOR NEXT SESSION: Discuss trip/ CHECK GOALS  Cammie Mcgee, PT, DPT # [phone] Physical Therapist - [facility]  01/27/2023, 6:12 PM

## 2023-01-31 ENCOUNTER — Encounter: Payer: Medicare HMO | Admitting: Physical Therapy

## 2023-02-07 ENCOUNTER — Encounter: Payer: Self-pay | Admitting: Physical Therapy

## 2023-02-07 ENCOUNTER — Ambulatory Visit: Payer: Medicare HMO | Attending: Student | Admitting: Physical Therapy

## 2023-02-07 ENCOUNTER — Encounter: Payer: Medicare HMO | Admitting: Physical Therapy

## 2023-02-07 DIAGNOSIS — M25562 Pain in left knee: Secondary | ICD-10-CM | POA: Diagnosis present

## 2023-02-07 DIAGNOSIS — M6281 Muscle weakness (generalized): Secondary | ICD-10-CM

## 2023-02-07 DIAGNOSIS — Z96652 Presence of left artificial knee joint: Secondary | ICD-10-CM | POA: Diagnosis present

## 2023-02-07 DIAGNOSIS — R269 Unspecified abnormalities of gait and mobility: Secondary | ICD-10-CM | POA: Diagnosis present

## 2023-02-07 DIAGNOSIS — M25662 Stiffness of left knee, not elsewhere classified: Secondary | ICD-10-CM | POA: Diagnosis present

## 2023-02-07 NOTE — Therapy (Signed)
 OUTPATIENT PHYSICAL THERAPY LOWER EXTREMITY TREATMENT/ RECERTIFICATION  Patient Name: Belinda Frazier MRN: [id number] DOB:Jan 07, 1956, 68 y.o., female Today's Date: 02/07/2023  END OF SESSION:  PT End of Session - 02/07/23 1439     Visit Number 11    Number of Visits 19    Date for PT Re-Evaluation 03/07/23    PT Start Time 1439    PT Stop Time 1528    PT Time Calculation (min) 49 min    Activity Tolerance Patient tolerated treatment well    Behavior During Therapy [facility] for tasks assessed/performed            Past Medical History:  Diagnosis Date   Anemia    Arthritis    Breast cancer (HCC) left breast   bilateral mastectomy   Chronic kidney disease    stage III   Diabetes mellitus without complication [facility])    Past Surgical History:  Procedure Laterality Date   CESAREAN SECTION  1990   x2 [id number]   CHOLECYSTECTOMY     2019   MASTECTOMY, RADICAL Bilateral 1996   TOTAL KNEE ARTHROPLASTY Left 12/21/2022   Procedure: TOTAL KNEE ARTHROPLASTY;  Surgeon: Ernie Cough, MD;  Location: [facility];  Service: Orthopedics;  Laterality: Left;   Patient Active Problem List   Diagnosis Date Noted   S/P total knee arthroplasty, 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    PCP: Sherial Bail, MD  REFERRING PROVIDER: Patti Rosina SAUNDERS, PA-C  REFERRING DIAG: Osteoarthritis of left knee joint  THERAPY DIAG:  Hx of total knee arthroplasty, left  Joint stiffness of knee, left  Muscle weakness (generalized)  Acute pain of left knee  Gait difficulty  Rationale for Evaluation and Treatment: Rehabilitation  ONSET DATE: 12/21/2022  SUBJECTIVE:   SUBJECTIVE STATEMENT: Pt. S/p L TKA on 12/21/22 and reports 8/10 L knee pain.  Pt. Reports chronic h/o L knee pain resulting in TKA.  Pt. Hoping to return to work by early 2025.  Pt. Enjoys traveling and going to beach.    PERTINENT HISTORY: See MD note  PAIN:  Are you having pain? Yes: NPRS scale: 8/10 Pain location: L knee Pain description: deep  aching/ sharp Aggravating factors: knee ROM Relieving factors: rest/ ice / meds  PRECAUTIONS: Knee  RED FLAGS: None   WEIGHT BEARING RESTRICTIONS: No  FALLS:  Has patient fallen in last 6 months? No  LIVING ENVIRONMENT: Lives with: lives with their spouse Lives in: House/apartment Stairs: Yes: External: 5 steps; on right going up  Has ramp in back of house.   Has following equipment at home: Vannie - 2 wheeled  OCCUPATION: Case production designer, theatre/television/film for workers Producer, Television/film/video.    PLOF: Independent  PATIENT GOALS: Increase L knee ROM/ strength/ return to work  NEXT MD VISIT: 01/05/23  OBJECTIVE:  Note: Objective measures were completed at Evaluation unless otherwise noted.  PATIENT SURVEYS:  FOTO initial 4/ goal 108  COGNITION: Overall cognitive status: Within functional limits for tasks assessed   SENSATION: WFL  EDEMA:  Circumferential: L/R : joint line (38.5/36 cm.), gastroc (33/33 cm.), distal quad (39.5/ 38 cm.).    MUSCLE LENGTH: Hamstrings: Right 80 deg; Left 74 deg  POSTURE: rounded shoulders  PALPATION: Generalized tenderness over L knee/ bandage in place  LOWER EXTREMITY ROM:  Active ROM Right eval Left eval  Hip flexion [facility]  Hip extension    Hip abduction    Hip adduction    Hip internal rotation    Hip external rotation    Knee flexion 141  deg. 98 deg. (Pain)  Knee extension 0 deg. -8 deg. (Pain)  Ankle dorsiflexion [facility]  Ankle plantarflexion [facility] WFL  Ankle inversion    Ankle eversion     (Blank rows = not tested)  LOWER EXTREMITY MMT:  MMT Right eval Left eval  Hip flexion 4 4  Hip extension    Hip abduction 4 4  Hip adduction    Hip internal rotation    Hip external rotation    Knee flexion 5 4-  Knee extension 5 4-  Ankle dorsiflexion    Ankle plantarflexion    Ankle inversion    Ankle eversion     (Blank rows = not tested)  FUNCTIONAL TESTS:  5 times sit to stand: TBD  GAIT: Distance walked: in clinic Assistive  device utilized: Walker - 2 wheeled Level of assistance: Modified independence Comments: L antalgic gait with limited L hip/knee flexion and step pattern.  Increase L knee pain with wt. Bearing and heavy UE assist on RW.    TODAY'S TREATMENT:                                                                                                                              DATE: 02/07/2023   Subjective:  Pt. Has returned to work with no issues reported.  Pt. States she is doing okay and not using SPC over past week.  Pt. Reports 1/10 L knee pain currently at rest.    There.ex.:  Scifit L6 B UE/LE for 10 min.  Warm-up prior to manual stretches.  Discussed weekend activities/ return to work.   Walking in clinic without assistive device with consistent recip. Gait pattern.  Pt. Ambulates with improved gait pattern with increase cadence.  Limited L heel strike as compared to R.  TRX: squats 20x/ lateral lunges 10x.  Modified technique to decrease L lateral knee discomfort.     Recip stairs with light to no UE assist 4 steps x 4.   Added standing gastroc stretches 3x each with holds.  See updated HEP.    Supine L knee AROM at end of tx.:  -4 to 120 deg.  (Pain limited).  Seated R/L MMT: hip flexion (4/4), knee extension (5/4+), knee flexion (5/4).      Pt. Will ice L knee at home today.     PATIENT EDUCATION:  Education details: Access Code: [email or web address] Person educated: Patient Education method: Medical Illustrator Education comprehension: verbalized understanding and returned demonstration  HOME EXERCISE PROGRAM: Access Code: [email or web address] URL: [email or web address] Date: 12/27/2022 Prepared by: Ozell Sero  Exercises - Supine Heel Slide with Strap  - 2 x daily - 7 x weekly - 2 sets - 10 reps - Active Straight Leg Raise with Quad Set  - 2 x daily - 7 x weekly - 2 sets - 10 reps - Sidelying Hip Abduction  - 2 x daily - 7 x weekly - 2 sets - 10 reps - Supine Quad  Set  - 2  x daily - 7 x weekly - 2 sets - 10 reps - Seated Knee Flexion AAROM  - 2 x daily - 7 x weekly - 2 sets - 10 reps - Seated Long Arc Quad  - 2 x daily - 7 x weekly - 2 sets - 10 reps  Access Code: [email or web address] URL: [email or web address] Date: 02/07/2023 Prepared by: Ozell Sero  Exercises - Standing Gastroc Stretch  - 2 x daily - 7 x weekly - 1 sets - 5 reps - Standing Gastroc Stretch on Step  - 2 x daily - 7 x weekly - 1 sets - 5 reps  ASSESSMENT:  CLINICAL IMPRESSION:   Patient arrives to treatment session motivated and c/o L lateral knee discomfort, not pain.  Session focused on L quad strengthening and improving L knee extension.  Pt. Continues to ambulate with slight L antalgic gait with limited knee extension/ gastroc length.  PT cued pt. On importance of L heel strike/ knee extension during stance phase of gait and issued updated HEP to focus on gastroc length.  See updated goals.   Patient will benefit from skilled PT services to increase L knee ROM/ strength to improve pain-free mobility/ normalized gait.    OBJECTIVE IMPAIRMENTS: Abnormal gait, decreased activity tolerance, decreased balance, decreased endurance, decreased mobility, difficulty walking, decreased ROM, decreased strength, hypomobility, increased edema, impaired flexibility, and pain.   ACTIVITY LIMITATIONS: carrying, lifting, bending, sitting, standing, squatting, stairs, transfers, and locomotion level  PARTICIPATION LIMITATIONS: cleaning, laundry, driving, shopping, community activity, and occupation  PERSONAL FACTORS: Fitness and Profession are also affecting patient's functional outcome.   REHAB POTENTIAL: Good  CLINICAL DECISION MAKING: Stable/uncomplicated  EVALUATION COMPLEXITY: Low   GOALS: Goals reviewed with patient? Yes  SHORT TERM GOALS: Target date: 02/21/23 Pt. Will be independent with HEP to increase L knee AROM (0 to >115 deg) to improve pain-free mobility.   Baseline:  -8 to 98  deg. (Pain limited).  1/6: -4 to 120 deg. Goal status: Partially met   LONG TERM GOALS: Target date: 03/07/23  Pt. Will increase FOTO to 53 to improve pain-free mobility.   Baseline: initial 4.  02/06/22: 63 Goal status: Goal met  2.  Pt. Able to ascend/ descend 5 steps with recip. Pattern and no c/o L knee pain to improve entering house.   Baseline: using ramp at home Goal status: Partially met  3.  Pt. Will ambulate with normalized gait pattern and recip. Gait pattern/ heel strike to improve return to work.   Baseline: L antalgic gait with RW.  Goal status: Partially met  PLAN:  PT FREQUENCY: 2x/week  PT DURATION: 4 weeks  PLANNED INTERVENTIONS: 97110-Therapeutic exercises, 97530- Therapeutic activity, 97112- Neuromuscular re-education, 97535- Self Care, [id number]- Manual therapy, [phone]- Gait training, [phone]- Electrical stimulation (unattended), Patient/Family education, Balance training, Stair training, Joint mobilization, Scar mobilization, DME instructions, and Cryotherapy  PLAN FOR NEXT SESSION: Reassess gastroc length/ increase L knee extension.   Ozell JAYSON Sero, PT, DPT # [id number] Physical Therapist - [facility]  02/07/2023, 6:51 PM

## 2023-02-09 ENCOUNTER — Encounter: Payer: Self-pay | Admitting: Physical Therapy

## 2023-02-09 ENCOUNTER — Ambulatory Visit: Payer: Medicare HMO | Admitting: Physical Therapy

## 2023-02-09 DIAGNOSIS — Z96652 Presence of left artificial knee joint: Secondary | ICD-10-CM

## 2023-02-09 DIAGNOSIS — R269 Unspecified abnormalities of gait and mobility: Secondary | ICD-10-CM

## 2023-02-09 DIAGNOSIS — M25662 Stiffness of left knee, not elsewhere classified: Secondary | ICD-10-CM

## 2023-02-09 DIAGNOSIS — M25562 Pain in left knee: Secondary | ICD-10-CM

## 2023-02-09 DIAGNOSIS — M6281 Muscle weakness (generalized): Secondary | ICD-10-CM

## 2023-02-09 NOTE — Therapy (Signed)
 OUTPATIENT PHYSICAL THERAPY LOWER EXTREMITY TREATMENT  Patient Name: Belinda Frazier MRN: [id number] DOB:[birth year], 68 y.o., female Today's Date: 02/09/2023  END OF SESSION:  PT End of Session - 02/09/23 0947     Visit Number 12    Number of Visits 19    Date for PT Re-Evaluation 03/07/23    PT Start Time 0947    PT Stop Time 1034    PT Time Calculation (min) 47 min    Activity Tolerance Patient tolerated treatment well    Behavior During Therapy [facility] for tasks assessed/performed            Past Medical History:  Diagnosis Date   Anemia    Arthritis    Breast cancer (HCC) left breast   bilateral mastectomy   Chronic kidney disease    stage III   Diabetes mellitus without complication [facility])    Past Surgical History:  Procedure Laterality Date   CESAREAN SECTION  1990   x2 8009,8006   CHOLECYSTECTOMY     2019   MASTECTOMY, RADICAL Bilateral 1996   TOTAL KNEE ARTHROPLASTY Left 12/21/2022   Procedure: TOTAL KNEE ARTHROPLASTY;  Surgeon: Ernie Cough, MD;  Location: [facility];  Service: Orthopedics;  Laterality: Left;   Patient Active Problem List   Diagnosis Date Noted   S/P total knee arthroplasty, 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    PCP: Sherial Bail, MD  REFERRING PROVIDER: Patti Rosina SAUNDERS, PA-C  REFERRING DIAG: Osteoarthritis of left knee joint  THERAPY DIAG:  Hx of total knee arthroplasty, left  Joint stiffness of knee, left  Muscle weakness (generalized)  Acute pain of left knee  Gait difficulty  Rationale for Evaluation and Treatment: Rehabilitation  ONSET DATE: 12/21/2022  SUBJECTIVE:   SUBJECTIVE STATEMENT: Pt. S/p L TKA on 12/21/22 and reports 8/10 L knee pain.  Pt. Reports chronic h/o L knee pain resulting in TKA.  Pt. Hoping to return to work by early 2025.  Pt. Enjoys traveling and going to beach.    PERTINENT HISTORY: See MD note  PAIN:  Are you having pain? Yes: NPRS scale: 8/10 Pain location: L knee Pain description: deep aching/  sharp Aggravating factors: knee ROM Relieving factors: rest/ ice / meds  PRECAUTIONS: Knee  RED FLAGS: None   WEIGHT BEARING RESTRICTIONS: No  FALLS:  Has patient fallen in last 6 months? No  LIVING ENVIRONMENT: Lives with: lives with their spouse Lives in: House/apartment Stairs: Yes: External: 5 steps; on right going up  Has ramp in back of house.   Has following equipment at home: Vannie - 2 wheeled  OCCUPATION: Case production designer, theatre/television/film for workers Producer, Television/film/video.    PLOF: Independent  PATIENT GOALS: Increase L knee ROM/ strength/ return to work  NEXT MD VISIT: 01/05/23  OBJECTIVE:  Note: Objective measures were completed at Evaluation unless otherwise noted.  PATIENT SURVEYS:  FOTO initial 4/ goal 61  COGNITION: Overall cognitive status: Within functional limits for tasks assessed   SENSATION: WFL  EDEMA:  Circumferential: L/R : joint line (38.5/36 cm.), gastroc (33/33 cm.), distal quad (39.5/ 38 cm.).    MUSCLE LENGTH: Hamstrings: Right 80 deg; Left 74 deg  POSTURE: rounded shoulders  PALPATION: Generalized tenderness over L knee/ bandage in place  LOWER EXTREMITY ROM:  Active ROM Right eval Left eval  Hip flexion [facility]  Hip extension    Hip abduction    Hip adduction    Hip internal rotation    Hip external rotation    Knee flexion 141 deg.  98 deg. (Pain)  Knee extension 0 deg. -8 deg. (Pain)  Ankle dorsiflexion [facility]  Ankle plantarflexion [facility] WFL  Ankle inversion    Ankle eversion     (Blank rows = not tested)  LOWER EXTREMITY MMT:  MMT Right eval Left eval  Hip flexion 4 4  Hip extension    Hip abduction 4 4  Hip adduction    Hip internal rotation    Hip external rotation    Knee flexion 5 4-  Knee extension 5 4-  Ankle dorsiflexion    Ankle plantarflexion    Ankle inversion    Ankle eversion     (Blank rows = not tested)  FUNCTIONAL TESTS:  5 times sit to stand: TBD  GAIT: Distance walked: in clinic Assistive device  utilized: Walker - 2 wheeled Level of assistance: Modified independence Comments: L antalgic gait with limited L hip/knee flexion and step pattern.  Increase L knee pain with wt. Bearing and heavy UE assist on RW.    TODAY'S TREATMENT:                                                                                                                              DATE: 02/09/2023   Subjective:  Pt. States lower leg/ ankle swelling after work/ prolonged standing last night.  Pt. Reports minimal L knee pain currently at rest. Pt. Noted improvements in knee pain after completing HEP for increasing gastrocnemius length. Pt. Described L lateral knee pain to be irritable with increase activity.    There.ex.:  Nustep L3 B UE/LE for 10 min.  Warm-up/ prior to gastroc stretches.    Amb. In gym with consistent recip. Gait pattern and cadence.  Use of mirror feedback for proper heel strike/ posture.  No increase c/o pain with improved heel strike/ toe off.   B Seated hamstring and gastrocnemius stretching, sliding hands from thigh to toes, hold for 3 seconds. NPS 1/10 and RPE 5   Walking scoops with hip hinge/ posterior chain lengthening in gym.  SBA/ CGA for safety and verbal cuing.    Standing TKE with GTB at //-bars with mod. Cuing for proper technique/ quad activation.    FFE lunge at 6 stair with focus on rear foot terminal extension L/R 20x until fatigue.  Cuing for proper technique/ lateral hip stability.     No supine there.ex. today.     Pt. Will ice L knee at home today.     PATIENT EDUCATION:  Education details: Access Code: [email or web address] Person educated: Patient Education method: Medical Illustrator Education comprehension: verbalized understanding and returned demonstration  HOME EXERCISE PROGRAM: Access Code: [email or web address] URL: [email or web address] Date: 12/27/2022 Prepared by: Ozell Sero  Exercises - Supine Heel Slide with Strap  - 2 x daily - 7 x weekly - 2  sets - 10 reps - Active Straight Leg Raise with Quad Set  - 2 x daily - 7 x weekly - 2 sets -  10 reps - Sidelying Hip Abduction  - 2 x daily - 7 x weekly - 2 sets - 10 reps - Supine Quad Set  - 2 x daily - 7 x weekly - 2 sets - 10 reps - Seated Knee Flexion AAROM  - 2 x daily - 7 x weekly - 2 sets - 10 reps - Seated Long Arc Quad  - 2 x daily - 7 x weekly - 2 sets - 10 reps  Access Code: [email or web address] URL: [email or web address] Date: 02/07/2023 Prepared by: Ozell Sero  Exercises - Standing Gastroc Stretch  - 2 x daily - 7 x weekly - 1 sets - 5 reps - Standing Gastroc Stretch on Step  - 2 x daily - 7 x weekly - 1 sets - 5 reps  ASSESSMENT:  CLINICAL IMPRESSION:   Session focused on L quad strengthening and improving L knee extension.  Pt. Continues to ambulate with slight L antalgic gait with limited knee extension/ gastroc length.  PT cued pt. On importance of L heel strike/ knee extension during stance phase of gait.  PT introduced modified tandem B LE exercises and patient responded well. Pt. Demonstrated the need for CGA with several exercises in modified tandem position. Patient will benefit from skilled PT services to increase L knee ROM/ strength to improve pain-free mobility/ normalized gait.    OBJECTIVE IMPAIRMENTS: Abnormal gait, decreased activity tolerance, decreased balance, decreased endurance, decreased mobility, difficulty walking, decreased ROM, decreased strength, hypomobility, increased edema, impaired flexibility, and pain.   ACTIVITY LIMITATIONS: carrying, lifting, bending, sitting, standing, squatting, stairs, transfers, and locomotion level  PARTICIPATION LIMITATIONS: cleaning, laundry, driving, shopping, community activity, and occupation  PERSONAL FACTORS: Fitness and Profession are also affecting patient's functional outcome.   REHAB POTENTIAL: Good  CLINICAL DECISION MAKING: Stable/uncomplicated  EVALUATION COMPLEXITY: Low   GOALS: Goals  reviewed with patient? Yes  SHORT TERM GOALS: Target date: 02/21/23 Pt. Will be independent with HEP to increase L knee AROM (0 to >115 deg) to improve pain-free mobility.   Baseline:  -8 to 98 deg. (Pain limited).  1/6: -4 to 120 deg. Goal status: Partially met   LONG TERM GOALS: Target date: 03/07/23  Pt. Will increase FOTO to 53 to improve pain-free mobility.   Baseline: initial 4.  02/06/22: 63 Goal status: Goal met  2.  Pt. Able to ascend/ descend 5 steps with recip. Pattern and no c/o L knee pain to improve entering house.   Baseline: using ramp at home Goal status: Partially met  3.  Pt. Will ambulate with normalized gait pattern and recip. Gait pattern/ heel strike to improve return to work.   Baseline: L antalgic gait with RW.  Goal status: Partially met  PLAN:  PT FREQUENCY: 2x/week  PT DURATION: 4 weeks  PLANNED INTERVENTIONS: 97110-Therapeutic exercises, 97530- Therapeutic activity, 97112- Neuromuscular re-education, 97535- Self Care, 02859- Manual therapy, [phone]- Gait training, [phone]- Electrical stimulation (unattended), Patient/Family education, Balance training, Stair training, Joint mobilization, Scar mobilization, DME instructions, and Cryotherapy  PLAN FOR NEXT SESSION: Check knee ext. ROM   Ozell JAYSON Sero, PT, DPT # [phone] Physical Therapist - [facility]  02/09/2023, 12:31 PM

## 2023-02-14 ENCOUNTER — Ambulatory Visit: Payer: Medicare HMO | Admitting: Physical Therapy

## 2023-02-14 ENCOUNTER — Encounter: Payer: Self-pay | Admitting: Physical Therapy

## 2023-02-14 DIAGNOSIS — Z96652 Presence of left artificial knee joint: Secondary | ICD-10-CM | POA: Diagnosis not present

## 2023-02-14 DIAGNOSIS — M25562 Pain in left knee: Secondary | ICD-10-CM

## 2023-02-14 DIAGNOSIS — M6281 Muscle weakness (generalized): Secondary | ICD-10-CM

## 2023-02-14 DIAGNOSIS — M25662 Stiffness of left knee, not elsewhere classified: Secondary | ICD-10-CM

## 2023-02-14 DIAGNOSIS — R269 Unspecified abnormalities of gait and mobility: Secondary | ICD-10-CM

## 2023-02-14 NOTE — Therapy (Signed)
 OUTPATIENT PHYSICAL THERAPY LOWER EXTREMITY TREATMENT  Patient Name: Belinda Frazier MRN: [id number] DOB:01/22/1956, 68 y.o., female Today's Date: 02/14/2023  END OF SESSION:  PT End of Session - 02/14/23 0943     Visit Number 13    Number of Visits 19    Date for PT Re-Evaluation 03/07/23    PT Start Time 0943    PT Stop Time 1045    PT Time Calculation (min) 62 min    Activity Tolerance Patient tolerated treatment well    Behavior During Therapy [facility] for tasks assessed/performed            Past Medical History:  Diagnosis Date   Anemia    Arthritis    Breast cancer (HCC) left breast   bilateral mastectomy   Chronic kidney disease    stage III   Diabetes mellitus without complication [facility])    Past Surgical History:  Procedure Laterality Date   CESAREAN SECTION  1990   x2 8009,8006   CHOLECYSTECTOMY     2019   MASTECTOMY, RADICAL Bilateral 1996   TOTAL KNEE ARTHROPLASTY Left 12/21/2022   Procedure: TOTAL KNEE ARTHROPLASTY;  Surgeon: Ernie Cough, MD;  Location: [facility];  Service: Orthopedics;  Laterality: Left;   Patient Active Problem List   Diagnosis Date Noted   S/P total knee arthroplasty, 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    PCP: Sherial Bail, MD  REFERRING PROVIDER: Patti Rosina SAUNDERS, PA-C  REFERRING DIAG: Osteoarthritis of left knee joint  THERAPY DIAG:  Hx of total knee arthroplasty, left  Joint stiffness of knee, left  Muscle weakness (generalized)  Acute pain of left knee  Gait difficulty  Rationale for Evaluation and Treatment: Rehabilitation  ONSET DATE: 12/21/2022  SUBJECTIVE:   SUBJECTIVE STATEMENT: Pt. S/p L TKA on 12/21/22 and reports 8/10 L knee pain.  Pt. Reports chronic h/o L knee pain resulting in TKA.  Pt. Hoping to return to work by early 2025.  Pt. Enjoys traveling and going to beach.    PERTINENT HISTORY: See MD note  PAIN:  Are you having pain? Yes: NPRS scale: 8/10 Pain location: L knee Pain description: deep aching/  sharp Aggravating factors: knee ROM Relieving factors: rest/ ice / meds  PRECAUTIONS: Knee  RED FLAGS: None   WEIGHT BEARING RESTRICTIONS: No  FALLS:  Has patient fallen in last 6 months? No  LIVING ENVIRONMENT: Lives with: lives with their spouse Lives in: House/apartment Stairs: Yes: External: 5 steps; on right going up  Has ramp in back of house.   Has following equipment at home: Vannie - 2 wheeled  OCCUPATION: Case production designer, theatre/television/film for workers Producer, Television/film/video.    PLOF: Independent  PATIENT GOALS: Increase L knee ROM/ strength/ return to work  NEXT MD VISIT: 01/05/23  OBJECTIVE:  Note: Objective measures were completed at Evaluation unless otherwise noted.  PATIENT SURVEYS:  FOTO initial 4/ goal 105  COGNITION: Overall cognitive status: Within functional limits for tasks assessed   SENSATION: WFL  EDEMA:  Circumferential: L/R : joint line (38.5/36 cm.), gastroc (33/33 cm.), distal quad (39.5/ 38 cm.).    MUSCLE LENGTH: Hamstrings: Right 80 deg; Left 74 deg  POSTURE: rounded shoulders  PALPATION: Generalized tenderness over L knee/ bandage in place  LOWER EXTREMITY ROM:  Active ROM Right eval Left eval  Hip flexion [facility]  Hip extension    Hip abduction    Hip adduction    Hip internal rotation    Hip external rotation    Knee flexion 141 deg.  98 deg. (Pain)  Knee extension 0 deg. -8 deg. (Pain)  Ankle dorsiflexion [facility]  Ankle plantarflexion [facility] WFL  Ankle inversion    Ankle eversion     (Blank rows = not tested)  LOWER EXTREMITY MMT:  MMT Right eval Left eval  Hip flexion 4 4  Hip extension    Hip abduction 4 4  Hip adduction    Hip internal rotation    Hip external rotation    Knee flexion 5 4-  Knee extension 5 4-  Ankle dorsiflexion    Ankle plantarflexion    Ankle inversion    Ankle eversion     (Blank rows = not tested)  FUNCTIONAL TESTS:  5 times sit to stand: TBD  GAIT: Distance walked: in clinic Assistive device  utilized: Walker - 2 wheeled Level of assistance: Modified independence Comments: L antalgic gait with limited L hip/knee flexion and step pattern.  Increase L knee pain with wt. Bearing and heavy UE assist on RW.    TODAY'S TREATMENT:                                                                                                                              DATE: 02/14/2023   Subjective:  Pt. States L knee pain 0/10 upon arrival. Pt. Reports increased confidence in L knee, but has reservations with kneeling. Pt. Noted continual improvements in knee pain with progressions of current HEP. Pt. Reviewed with SPT that they liked the FFE lunging exercises.   There.ex.:  Sci-Fit L6 B UE/LE for 10 min.  Warm-up/ prior to gastroc stretches.    Amb. In gym with consistent recip. Gait pattern and cadence.  Use of mirror feedback for proper heel strike/ posture.  No increase c/o pain with improved heel strike/ toe off.   1st step with handrail assist: gastrocnemius stretching 5 reps at 30 seconds per side. NPS 1/10 and RPE 3.  B Seated hamstring and gastrocnemius stretching, sliding hands from thigh to toes, hold for 3 seconds. NPS 1/10 and RPE 2    B Standing single leg step down 20 x 2 ea. Cuing for hip abductor activation and maintaining neutral pelvic tilt. NPS 1/10 and RPE 2/10  FFE lunge at 6 stair with focus on rear foot terminal extension L/R 20x until fatigue.  Cuing for proper technique/ lateral hip stability. Added 10 lbs. To contralateral side to challenge lateral hip stability. NPS 1/10 and RPE 4/10  See Updated HEP  No supine there.ex. today.     Pt. Will ice L knee at home today.     PATIENT EDUCATION:  Education details: Access Code: [email or web address] Person educated: Patient Education method: Medical Illustrator Education comprehension: verbalized understanding and returned demonstration  HOME EXERCISE PROGRAM: Access Code: [email or web address] URL:  [email or web address] Date: 12/27/2022 Prepared by: Ozell Sero  Exercises - Supine Heel Slide with Strap  - 2 x daily - 7 x weekly - 2 sets - 10  reps - Active Straight Leg Raise with Quad Set  - 2 x daily - 7 x weekly - 2 sets - 10 reps - Sidelying Hip Abduction  - 2 x daily - 7 x weekly - 2 sets - 10 reps - Supine Quad Set  - 2 x daily - 7 x weekly - 2 sets - 10 reps - Seated Knee Flexion AAROM  - 2 x daily - 7 x weekly - 2 sets - 10 reps - Seated Long Arc Quad  - 2 x daily - 7 x weekly - 2 sets - 10 reps  Access Code: [email or web address] URL: [email or web address] Date: 02/07/2023 Prepared by: Ozell Sero  Exercises - Standing Gastroc Stretch  - 2 x daily - 7 x weekly - 1 sets - 5 reps - Standing Gastroc Stretch on Step  - 2 x daily - 7 x weekly - 1 sets - 5 reps  Access Code: [email or web address] URL: [email or web address] Date: 02/14/2023 Prepared by: Ozell Sero Exercises - Sidelying Hip Abduction - 2 x daily - 7 x weekly - 2 sets - 10 reps - Supine Quad Set - 2 x daily - 7 x weekly - 2 sets - 10 reps - Seated Long Arc Quad - 2 x daily - 7 x weekly - 2 sets - 10 reps - Forward Fall Out Lunge with Dumbbells - 1 x daily - 7 x weekly - 3 sets - 10 reps - Supine Bridge - 1 x daily - 7 x weekly - 3 sets - 10 reps    ASSESSMENT:  CLINICAL IMPRESSION:   Session focused on L quad strengthening and improving L knee extension.  Pt. Continues to ambulate with slight L antalgic gait with limited knee extension/ gastroc length.  SPT cued pt. on importance of L heel strike/ knee extension during stance phase of gait as well as dorsiflexion activation.  PT introduced modified tandem B LE exercises and patient responded well. Pt. Demonstrated the need for CGA with several exercises in modified tandem position. Pt. Showed improved balance in tandem stance exercises compared to previous visit with minimal handrail use. Patient will benefit from skilled PT services to increase  L knee ROM/ strength to improve pain-free mobility/ normalized gait.  OBJECTIVE IMPAIRMENTS: Abnormal gait, decreased activity tolerance, decreased balance, decreased endurance, decreased mobility, difficulty walking, decreased ROM, decreased strength, hypomobility, increased edema, impaired flexibility, and pain.   ACTIVITY LIMITATIONS: carrying, lifting, bending, sitting, standing, squatting, stairs, transfers, and locomotion level  PARTICIPATION LIMITATIONS: cleaning, laundry, driving, shopping, community activity, and occupation  PERSONAL FACTORS: Fitness and Profession are also affecting patient's functional outcome.   REHAB POTENTIAL: Good  CLINICAL DECISION MAKING: Stable/uncomplicated  EVALUATION COMPLEXITY: Low   GOALS: Goals reviewed with patient? Yes  SHORT TERM GOALS: Target date: 02/21/23 Pt. Will be independent with HEP to increase L knee AROM (0 to >115 deg) to improve pain-free mobility.   Baseline:  -8 to 98 deg. (Pain limited).  1/6: -4 to 120 deg. Goal status: Partially met   LONG TERM GOALS: Target date: 03/07/23  Pt. Will increase FOTO to 53 to improve pain-free mobility.   Baseline: initial 4.  02/06/22: 63 Goal status: Goal met  2.  Pt. Able to ascend/ descend 5 steps with recip. Pattern and no c/o L knee pain to improve entering house.   Baseline: using ramp at home Goal status: Partially met  3.  Pt. Will ambulate with normalized gait pattern and recip. Gait pattern/ heel strike to improve return to work.  Baseline: L antalgic gait with RW.  Goal status: Partially met  PLAN:  PT FREQUENCY: 2x/week  PT DURATION: 4 weeks  PLANNED INTERVENTIONS: 97110-Therapeutic exercises, 97530- Therapeutic activity, 97112- Neuromuscular re-education, 97535- Self Care, 02859- Manual therapy, [id number]- Gait training, [id number]- Electrical stimulation (unattended), Patient/Family education, Balance training, Stair training, Joint mobilization, Scar mobilization, DME  instructions, and Cryotherapy  PLAN FOR NEXT SESSION: Incorporate hip abduction strength in lunging positions with a focus on using hip abductors to minimize genu valgum.   Ozell JAYSON Sero, PT, DPT # [phone] Physical Therapist - [facility]  [place]ley IVAR Bunker  [employer] SPT   02/14/2023, 12:27 PM

## 2023-02-16 ENCOUNTER — Encounter: Payer: Medicare HMO | Admitting: Physical Therapy

## 2023-02-22 ENCOUNTER — Ambulatory Visit: Payer: Medicare HMO | Admitting: Physical Therapy

## 2023-02-22 DIAGNOSIS — M25562 Pain in left knee: Secondary | ICD-10-CM

## 2023-02-22 DIAGNOSIS — Z96652 Presence of left artificial knee joint: Secondary | ICD-10-CM

## 2023-02-22 DIAGNOSIS — M25662 Stiffness of left knee, not elsewhere classified: Secondary | ICD-10-CM

## 2023-02-22 DIAGNOSIS — R269 Unspecified abnormalities of gait and mobility: Secondary | ICD-10-CM

## 2023-02-22 NOTE — Therapy (Unsigned)
OUTPATIENT PHYSICAL THERAPY LOWER EXTREMITY TREATMENT  Patient Name: Belinda Frazier MRN: [id number] DOB:[birth year], 68 y.o., female Today's Date: 02/22/2023  END OF SESSION:  PT End of Session - 02/22/23 1608     Visit Number 14    Number of Visits 19    Date for PT Re-Evaluation 03/07/23    PT Start Time 1600    PT Stop Time 1649    PT Time Calculation (min) 49 min    Activity Tolerance Patient tolerated treatment well    Behavior During Therapy [facility] for tasks assessed/performed            Past Medical History:  Diagnosis Date   Anemia    Arthritis    Breast cancer (HCC) left breast   bilateral mastectomy   Chronic kidney disease    stage III   Diabetes mellitus without complication [facility])    Past Surgical History:  Procedure Laterality Date   CESAREAN SECTION  1990   x2 7628,3151   CHOLECYSTECTOMY     2019   MASTECTOMY, RADICAL Bilateral 1996   TOTAL KNEE ARTHROPLASTY Left 12/21/2022   Procedure: TOTAL KNEE ARTHROPLASTY;  Surgeon: Durene Romans, MD;  Location: [facility];  Service: Orthopedics;  Laterality: Left;   Patient Active Problem List   Diagnosis Date Noted   S/P total knee arthroplasty, 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    PCP: Enid Baas, MD  REFERRING PROVIDER: Cassandria Anger, PA-C  REFERRING DIAG: Osteoarthritis of left knee joint  THERAPY DIAG:  Hx of total knee arthroplasty, left  Joint stiffness of knee, left  Acute pain of left knee  Gait difficulty  Rationale for Evaluation and Treatment: Rehabilitation  ONSET DATE: 12/21/2022  SUBJECTIVE:   SUBJECTIVE STATEMENT: Pt. S/p L TKA on 12/21/22 and reports 8/10 L knee pain.  Pt. Reports chronic h/o L knee pain resulting in TKA.  Pt. Hoping to return to work by early 2025.  Pt. Enjoys traveling and going to beach.    PERTINENT HISTORY: See MD note  PAIN:  Are you having pain? Yes: NPRS scale: 8/10 Pain location: L knee Pain description: deep aching/ sharp Aggravating factors: knee  ROM Relieving factors: rest/ ice / meds  PRECAUTIONS: Knee  RED FLAGS: None   WEIGHT BEARING RESTRICTIONS: No  FALLS:  Has patient fallen in last 6 months? No  LIVING ENVIRONMENT: Lives with: lives with their spouse Lives in: House/apartment Stairs: Yes: External: 5 steps; on right going up  Has ramp in back of house.   Has following equipment at home: Dan Humphreys - 2 wheeled  OCCUPATION: Case Production designer, theatre/television/film for workers Producer, television/film/video.    PLOF: Independent  PATIENT GOALS: Increase L knee ROM/ strength/ return to work  NEXT MD VISIT: 01/05/23  OBJECTIVE:  Note: Objective measures were completed at Evaluation unless otherwise noted.  PATIENT SURVEYS:  FOTO initial 4/ goal 18  COGNITION: Overall cognitive status: Within functional limits for tasks assessed   SENSATION: WFL  EDEMA:  Circumferential: L/R : joint line (38.5/36 cm.), gastroc (33/33 cm.), distal quad (39.5/ 38 cm.).    MUSCLE LENGTH: Hamstrings: Right 80 deg; Left 74 deg  POSTURE: rounded shoulders  PALPATION: Generalized tenderness over L knee/ bandage in place  LOWER EXTREMITY ROM:  Active ROM Right eval Left eval  Hip flexion [facility]  Hip extension    Hip abduction    Hip adduction    Hip internal rotation    Hip external rotation    Knee flexion 141 deg. 98 deg. (Pain)  Knee extension 0 deg. -8 deg. (Pain)  Ankle dorsiflexion [facility]  Ankle plantarflexion [facility] WFL  Ankle inversion    Ankle eversion     (Blank rows = not tested)  LOWER EXTREMITY MMT:  MMT Right eval Left eval  Hip flexion 4 4  Hip extension    Hip abduction 4 4  Hip adduction    Hip internal rotation    Hip external rotation    Knee flexion 5 4-  Knee extension 5 4-  Ankle dorsiflexion    Ankle plantarflexion    Ankle inversion    Ankle eversion     (Blank rows = not tested)  FUNCTIONAL TESTS:  5 times sit to stand: TBD  GAIT: Distance walked: in clinic Assistive device utilized: Walker - 2 wheeled Level of  assistance: Modified independence Comments: L antalgic gait with limited L hip/knee flexion and step pattern.  Increase L knee pain with wt. Bearing and heavy UE assist on RW.    TODAY'S TREATMENT:                                                                                                                              DATE: 02/22/2023   Subjective:  Pt. States L knee pain 0/10 upon arrival. Pt. Reports increased confidence in L knee while ambulating on stairs. Pt. Noted continual improvements in knee pain with progressions of current HEP. Pt. Reviewed with PT that they liked current HEP. Pt. Explains that they have minor limitations with with putting socks and shoes on "when putting one knee on top of the other."  There.ex.:  Sci-Fit L6 B UE/LE for 10 min.  Warm-up/ prior to gastroc stretches.    Amb. In gym with consistent recip. Gait pattern and cadence.  Use of mirror feedback for proper heel strike/ posture.  No increase c/o pain with improved heel strike/ toe off.   1st step with handrail assist: gastrocnemius stretching 5 reps at 20 seconds per side. NPS 1/10 and RPE 3.  B Standing single leg step down 20 x 2 ea. Cuing for hip abductor activation and maintaining neutral pelvic tilt. NPS 1/10 and RPE 2/10  FFE lunge at 6" stair with focus on rear foot terminal extension L/R 20x until fatigue.  Cuing for proper technique/ lateral hip stability. Added 10 lbs. To contralateral side to challenge lateral hip stability. NPS 1/10 and RPE 4/10  Terminal knee extension BTB anchored to //-bars 3x10. RPE 3/10   No supine there.ex. today.     Pt. Will ice L knee at home today.     PATIENT EDUCATION:  Education details: Access Code: [email or web address] Person educated: Patient Education method: Medical illustrator Education comprehension: verbalized understanding and returned demonstration  HOME EXERCISE PROGRAM: Access Code: [email or web address] URL: [email or web address] Date:  12/27/2022 Prepared by: Dorene Grebe  Exercises - Supine Heel Slide with Strap  - 2 x daily - 7 x weekly - 2 sets - 10 reps -  Active Straight Leg Raise with Quad Set  - 2 x daily - 7 x weekly - 2 sets - 10 reps - Sidelying Hip Abduction  - 2 x daily - 7 x weekly - 2 sets - 10 reps - Supine Quad Set  - 2 x daily - 7 x weekly - 2 sets - 10 reps - Seated Knee Flexion AAROM  - 2 x daily - 7 x weekly - 2 sets - 10 reps - Seated Long Arc Quad  - 2 x daily - 7 x weekly - 2 sets - 10 reps  Access Code: [email or web address] URL: [email or web address] Date: 02/07/2023 Prepared by: Dorene Grebe  Exercises - Standing Gastroc Stretch  - 2 x daily - 7 x weekly - 1 sets - 5 reps - Standing Gastroc Stretch on Step  - 2 x daily - 7 x weekly - 1 sets - 5 reps  Access Code: [email or web address] URL: [email or web address] Date: 02/14/2023 Prepared by: Dorene Grebe Exercises - Sidelying Hip Abduction - 2 x daily - 7 x weekly - 2 sets - 10 reps - Supine Quad Set - 2 x daily - 7 x weekly - 2 sets - 10 reps - Seated Long Arc Quad - 2 x daily - 7 x weekly - 2 sets - 10 reps - Forward Fall Out Lunge with Dumbbells - 1 x daily - 7 x weekly - 3 sets - 10 reps - Supine Bridge - 1 x daily - 7 x weekly - 3 sets - 10 reps    ASSESSMENT:  CLINICAL IMPRESSION:   Session focused on L quad strengthening and improving L knee extension. PT introduced new modified tandem B LE exercises and patient responded well. Pt. No longer requires CGA during tandem stance exercises challenging LLE stability. Pt. Showed improved balance in tandem stance exercises compared to previous visit with minimal handrail use. Pt. Has demonstrated improved coordination with single leg activities. Overall, patient confidence in left knee TKA is improving. Patient will benefit from skilled PT services to increase L knee ROM/ strength to improve pain-free mobility/ normalized gait.  OBJECTIVE IMPAIRMENTS: Abnormal gait, decreased activity  tolerance, decreased balance, decreased endurance, decreased mobility, difficulty walking, decreased ROM, decreased strength, hypomobility, increased edema, impaired flexibility, and pain.   ACTIVITY LIMITATIONS: carrying, lifting, bending, sitting, standing, squatting, stairs, transfers, and locomotion level  PARTICIPATION LIMITATIONS: cleaning, laundry, driving, shopping, community activity, and occupation  PERSONAL FACTORS: Fitness and Profession are also affecting patient's functional outcome.   REHAB POTENTIAL: Good  CLINICAL DECISION MAKING: Stable/uncomplicated  EVALUATION COMPLEXITY: Low   GOALS: Goals reviewed with patient? Yes  SHORT TERM GOALS: Target date: 02/21/23 Pt. Will be independent with HEP to increase L knee AROM (0 to >115 deg) to improve pain-free mobility.   Baseline:  -8 to 98 deg. (Pain limited).  1/6: -4 to 120 deg. Goal status: Partially met   LONG TERM GOALS: Target date: 03/07/23  Pt. Will increase FOTO to 53 to improve pain-free mobility.   Baseline: initial 4.  02/06/22: 63 Goal status: Goal met  2.  Pt. Able to ascend/ descend 5 steps with recip. Pattern and no c/o L knee pain to improve entering house.   Baseline: using ramp at home Goal status: Partially met  3.  Pt. Will ambulate with normalized gait pattern and recip. Gait pattern/ heel strike to improve return to work.   Baseline: L antalgic gait with RW.  Goal status: Partially met  PLAN:  PT FREQUENCY: 2x/week  PT DURATION: 4  weeks  PLANNED INTERVENTIONS: 97110-Therapeutic exercises, 97530- Therapeutic activity, [id number]- Neuromuscular re-education, 97535- Self Care, 65784- Manual therapy, [phone]- Gait training, [phone]- Electrical stimulation (unattended), Patient/Family education, Balance training, Stair training, Joint mobilization, Scar mobilization, DME instructions, and Cryotherapy  PLAN FOR NEXT SESSION:  1 more PT tx. Session scheduled.  Check goals/ schedule.    Cammie Mcgee, PT,  DPT # [phone] Physical Therapist - [facility]  Dolores Frame  [employer] SPT   02/22/2023, 4:52 PM

## 2023-02-23 ENCOUNTER — Encounter: Payer: Self-pay | Admitting: Physical Therapy

## 2023-03-01 ENCOUNTER — Ambulatory Visit: Payer: Medicare HMO | Admitting: Physical Therapy

## 2023-03-03 ENCOUNTER — Encounter: Payer: Medicare HMO | Admitting: Physical Therapy

## 2023-03-08 ENCOUNTER — Ambulatory Visit: Payer: Medicare HMO | Attending: Student | Admitting: Physical Therapy

## 2023-03-08 ENCOUNTER — Encounter: Payer: Self-pay | Admitting: Physical Therapy

## 2023-03-08 DIAGNOSIS — M25562 Pain in left knee: Secondary | ICD-10-CM | POA: Insufficient documentation

## 2023-03-08 DIAGNOSIS — R269 Unspecified abnormalities of gait and mobility: Secondary | ICD-10-CM | POA: Diagnosis present

## 2023-03-08 DIAGNOSIS — M25662 Stiffness of left knee, not elsewhere classified: Secondary | ICD-10-CM | POA: Diagnosis present

## 2023-03-08 DIAGNOSIS — M6281 Muscle weakness (generalized): Secondary | ICD-10-CM | POA: Insufficient documentation

## 2023-03-08 DIAGNOSIS — Z96652 Presence of left artificial knee joint: Secondary | ICD-10-CM | POA: Diagnosis present

## 2023-03-08 NOTE — Therapy (Signed)
 OUTPATIENT PHYSICAL THERAPY LOWER EXTREMITY TREATMENT/ RECERTIFICATION/ DISCHARGE  Patient Name: Belinda Frazier MRN: [id number] DOB:23-Oct-1955, 68 y.o., female Today's Date: 03/08/2023  END OF SESSION:  PT End of Session - 03/08/23 1336     Visit Number 15    Number of Visits 15    Date for PT Re-Evaluation 03/08/23    PT Start Time 1337    Activity Tolerance Patient tolerated treatment well    Behavior During Therapy [facility] for tasks assessed/performed            Past Medical History:  Diagnosis Date   Anemia    Arthritis    Breast cancer (HCC) left breast   bilateral mastectomy   Chronic kidney disease    stage III   Diabetes mellitus without complication [facility])    Past Surgical History:  Procedure Laterality Date   CESAREAN SECTION  1990   x2 8009,8006   CHOLECYSTECTOMY     2019   MASTECTOMY, RADICAL Bilateral 1996   TOTAL KNEE ARTHROPLASTY Left 12/21/2022   Procedure: TOTAL KNEE ARTHROPLASTY;  Surgeon: Ernie Cough, MD;  Location: [facility];  Service: Orthopedics;  Laterality: Left;   Patient Active Problem List   Diagnosis Date Noted   S/P total knee arthroplasty, 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   PCP: Sherial Bail, MD  REFERRING PROVIDER: Patti Rosina SAUNDERS, PA-C  REFERRING DIAG: Osteoarthritis of left knee joint  THERAPY DIAG:  Hx of total knee arthroplasty, left  Joint stiffness of knee, left  Acute pain of left knee  Gait difficulty  Muscle weakness (generalized)  Rationale for Evaluation and Treatment: Rehabilitation  ONSET DATE: 12/21/2022  SUBJECTIVE:   SUBJECTIVE STATEMENT: Pt. S/p L TKA on 12/21/22 and reports 8/10 L knee pain.  Pt. Reports chronic h/o L knee pain resulting in TKA.  Pt. Hoping to return to work by early 2025.  Pt. Enjoys traveling and going to beach.    PERTINENT HISTORY: See MD note  PAIN:  Are you having pain? Yes: NPRS scale: 8/10 Pain location: L knee Pain description: deep aching/ sharp Aggravating factors: knee  ROM Relieving factors: rest/ ice / meds  PRECAUTIONS: Knee  RED FLAGS: None   WEIGHT BEARING RESTRICTIONS: No  FALLS:  Has patient fallen in last 6 months? No  LIVING ENVIRONMENT: Lives with: lives with their spouse Lives in: House/apartment Stairs: Yes: External: 5 steps; on right going up  Has ramp in back of house.   Has following equipment at home: Vannie - 2 wheeled  OCCUPATION: Case production designer, theatre/television/film for workers Producer, Television/film/video.    PLOF: Independent  PATIENT GOALS: Increase L knee ROM/ strength/ return to work  NEXT MD VISIT: 01/05/23  OBJECTIVE:  Note: Objective measures were completed at Evaluation unless otherwise noted.  PATIENT SURVEYS:  FOTO initial 4/ goal 12  COGNITION: Overall cognitive status: Within functional limits for tasks assessed   SENSATION: WFL  EDEMA:  Circumferential: L/R : joint line (38.5/36 cm.), gastroc (33/33 cm.), distal quad (39.5/ 38 cm.).    MUSCLE LENGTH: Hamstrings: Right 80 deg; Left 74 deg  POSTURE: rounded shoulders  PALPATION: Generalized tenderness over L knee/ bandage in place  LOWER EXTREMITY ROM:  Active ROM Right eval Left eval  Hip flexion [facility]  Hip extension    Hip abduction    Hip adduction    Hip internal rotation    Hip external rotation    Knee flexion 141 deg. 98 deg. (Pain)  Knee extension 0 deg. -8 deg. (Pain)  Ankle dorsiflexion [facility]  WFL  Ankle plantarflexion [facility] WFL  Ankle inversion    Ankle eversion     (Blank rows = not tested)  LOWER EXTREMITY MMT:  MMT Right eval Left eval  Hip flexion 4 4  Hip extension    Hip abduction 4 4  Hip adduction    Hip internal rotation    Hip external rotation    Knee flexion 5 4-  Knee extension 5 4-  Ankle dorsiflexion    Ankle plantarflexion    Ankle inversion    Ankle eversion     (Blank rows = not tested)  FUNCTIONAL TESTS:  5 times sit to stand: TBD  GAIT: Distance walked: in clinic Assistive device utilized: Walker - 2 wheeled Level of  assistance: Modified independence Comments: L antalgic gait with limited L hip/knee flexion and step pattern.  Increase L knee pain with wt. Bearing and heavy UE assist on RW.    TODAY'S TREATMENT:                                                                                                                              DATE: 03/08/2023   Subjective:  Pt. Reports no L knee pain upon arrival.  Pt. Reports increased confidence in L knee while ambulating on stairs.  Pt. Has been happy with overall progress of L knee with everyday tasks.  Pt. Participates with gym based ex. 2x/week.  Pt. Has L shoulder MRI scheduled for next week.      There.ex.:  Nustep L5 B UE/LE for 10 min.  Warm-up/ discussed daily activities.      1st step with handrail assist: gastrocnemius stretching 5 reps at 20 seconds per side. NPS 1/10 and RPE 3.  FFE lunge at 6 stair with focus on rear foot terminal extension L/R 20x until fatigue.  Cuing for proper technique/ lateral hip stability.   Supine L hamstring/ quad stretches.  Reassessment of L knee AROM: -4 to 120 deg.   There.act.:  Walking outside: varying terrain (grass/ parking lot)/ stepping up and down curbs/ outside stairs with recip. Pattern (L handrail).    Blaze pods: hip abduction 3 pods on L/R.  Hip flexion/ extension 3 pods on L/R.  Lateral stepping with FOCUS pod touches.      Discussed HEP   Pt. Will ice L knee at home today.     PATIENT EDUCATION:  Education details: Access Code: [email or web address] Person educated: Patient Education method: Medical Illustrator Education comprehension: verbalized understanding and returned demonstration  HOME EXERCISE PROGRAM: Access Code: [email or web address] URL: [email or web address] Date: 12/27/2022 Prepared by: Ozell Sero  Exercises - Supine Heel Slide with Strap  - 2 x daily - 7 x weekly - 2 sets - 10 reps - Active Straight Leg Raise with Quad Set  - 2 x daily - 7 x weekly - 2 sets - 10 reps -  Sidelying Hip Abduction  - 2 x daily - 7 x weekly - 2  sets - 10 reps - Supine Quad Set  - 2 x daily - 7 x weekly - 2 sets - 10 reps - Seated Knee Flexion AAROM  - 2 x daily - 7 x weekly - 2 sets - 10 reps - Seated Long Arc Quad  - 2 x daily - 7 x weekly - 2 sets - 10 reps  Access Code: [email or web address] URL: [email or web address] Date: 02/07/2023 Prepared by: Ozell Sero  Exercises - Standing Gastroc Stretch  - 2 x daily - 7 x weekly - 1 sets - 5 reps - Standing Gastroc Stretch on Step  - 2 x daily - 7 x weekly - 1 sets - 5 reps  Access Code: [email or web address] URL: [email or web address] Date: 02/14/2023 Prepared by: Ozell Sero Exercises - Sidelying Hip Abduction - 2 x daily - 7 x weekly - 2 sets - 10 reps - Supine Quad Set - 2 x daily - 7 x weekly - 2 sets - 10 reps - Seated Long Arc Quad - 2 x daily - 7 x weekly - 2 sets - 10 reps - Forward Fall Out Lunge with Dumbbells - 1 x daily - 7 x weekly - 3 sets - 10 reps - Supine Bridge - 1 x daily - 7 x weekly - 3 sets - 10 reps    ASSESSMENT:  CLINICAL IMPRESSION:   Session focused on L quad strengthening and improving L knee extension. Pt. Has progressed well towards all PT goals and will be discharged from PT at this time.  Pt. Understands current gym based there.ex. and instructed to contact PT if any questions or concerns.    OBJECTIVE IMPAIRMENTS: Abnormal gait, decreased activity tolerance, decreased balance, decreased endurance, decreased mobility, difficulty walking, decreased ROM, decreased strength, hypomobility, increased edema, impaired flexibility, and pain.   ACTIVITY LIMITATIONS: carrying, lifting, bending, sitting, standing, squatting, stairs, transfers, and locomotion level  PARTICIPATION LIMITATIONS: cleaning, laundry, driving, shopping, community activity, and occupation  PERSONAL FACTORS: Fitness and Profession are also affecting patient's functional outcome.   REHAB POTENTIAL: Good  CLINICAL DECISION  MAKING: Stable/uncomplicated  EVALUATION COMPLEXITY: Low   GOALS: Goals reviewed with patient? Yes  SHORT TERM GOALS: Target date: 02/21/23 Pt. Will be independent with HEP to increase L knee AROM (0 to >115 deg) to improve pain-free mobility.   Baseline:  -8 to 98 deg. (Pain limited).  1/6: -4 to 120 deg.  2/4: -4 to 125 deg.   Goal status: Goal met   LONG TERM GOALS: Target date: 03/08/23  Pt. Will increase FOTO to 53 to improve pain-free mobility.   Baseline: initial 4.  02/06/22: 63 Goal status: Goal met  2.  Pt. Able to ascend/ descend 5 steps with recip. Pattern and no c/o L knee pain to improve entering house.   Baseline: using ramp at home.  2/4: recip. Gait with no L knee pain.   Goal status: Goal met  3.  Pt. Will ambulate with normalized gait pattern and recip. Gait pattern/ heel strike to improve return to work.   Baseline: L antalgic gait with RW.  2/4: normalized gait pattern Goal status: Goal met  PLAN:  PT FREQUENCY: 1x visit  PLANNED INTERVENTIONS: 97110-Therapeutic exercises, 97530- Therapeutic activity, 97112- Neuromuscular re-education, 97535- Self Care, [id number]- Manual therapy, [phone]- Gait training, [phone]- Electrical stimulation (unattended), Patient/Family education, Balance training, Stair training, Joint mobilization, Scar mobilization, DME instructions, and Cryotherapy  PLAN FOR NEXT SESSION:  Discharge visit.  Pt. Instructed to contact PT if any questions or concerns.  Ozell JAYSON Sero, PT, DPT # [phone] Physical Therapist - [facility]  Beverley IVAR Bunker  [employer] SPT   03/08/2023, 1:37 PM

## 2023-04-09 IMAGING — US US EXTREM LOW VENOUS*L*
1 series · 13 of 24 positions shown · non-contrast
Comparison: None.

CLINICAL DATA: Uncontrolled diabetes mellitus with hyperglycemia,
left calf pain x6 months



[Series 1: us venous img lower uni left (dvt) · portal-venous · 13 of 35 slices shown]
[im 1/35]
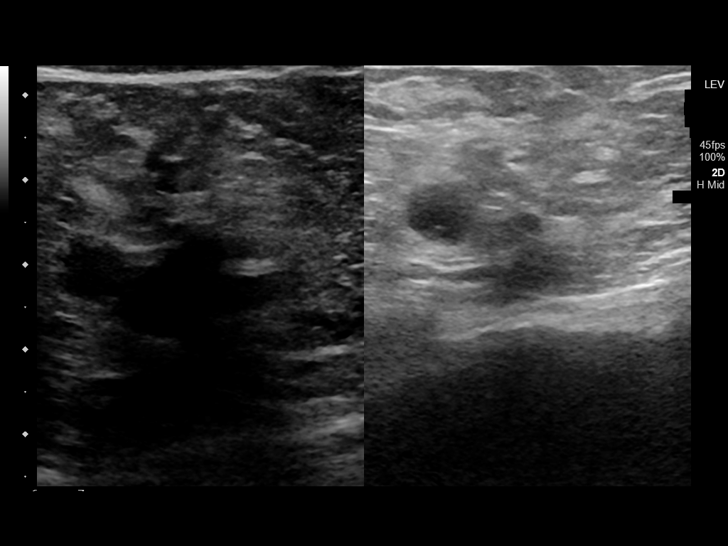
[im 3/35]
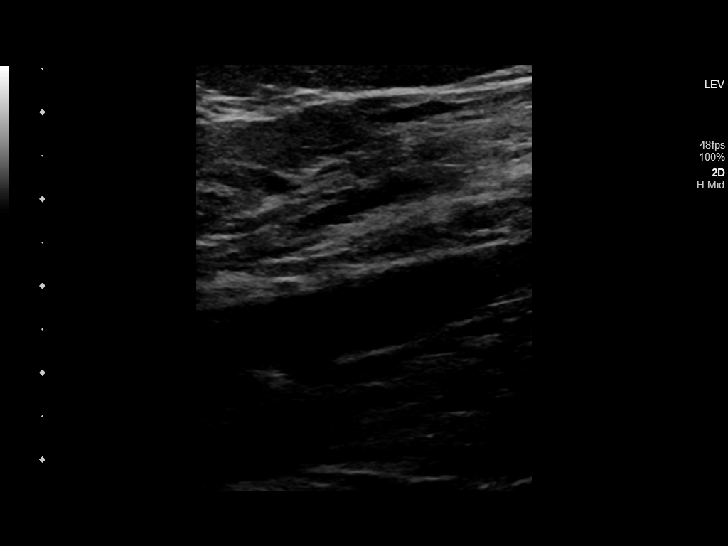
[im 6/35]
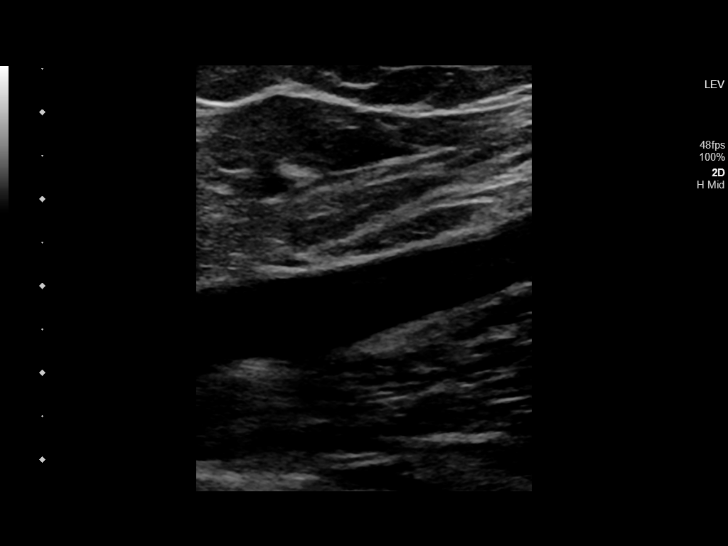
[im 9/35]
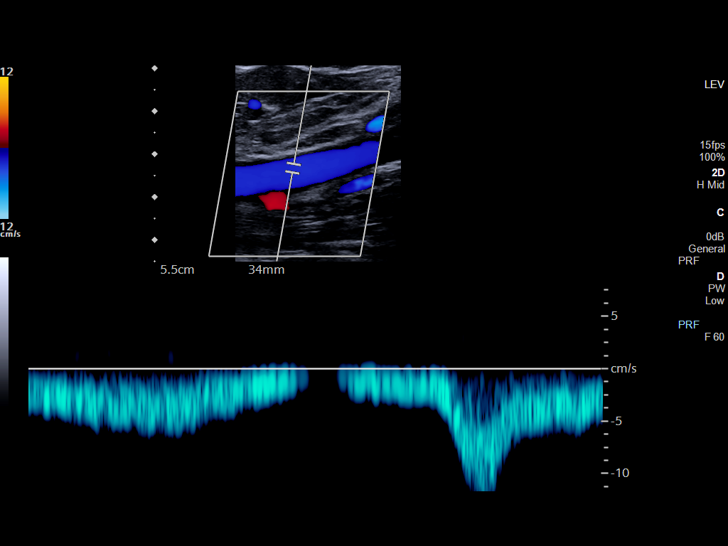
[im 12/35]
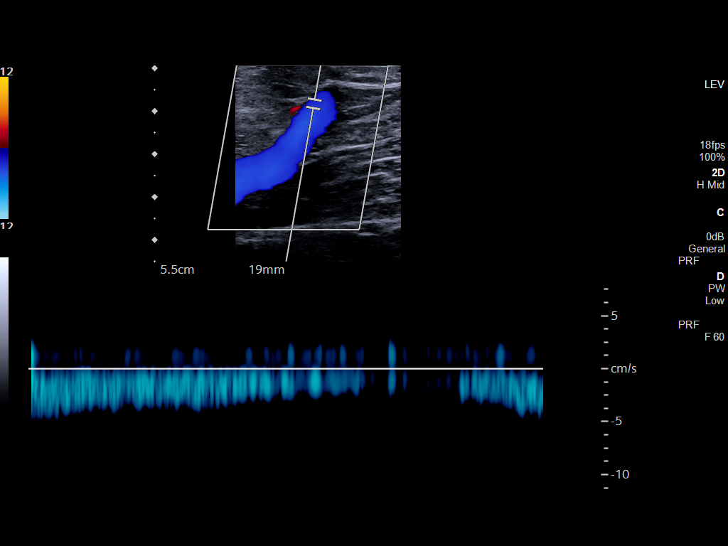
[im 15/35]
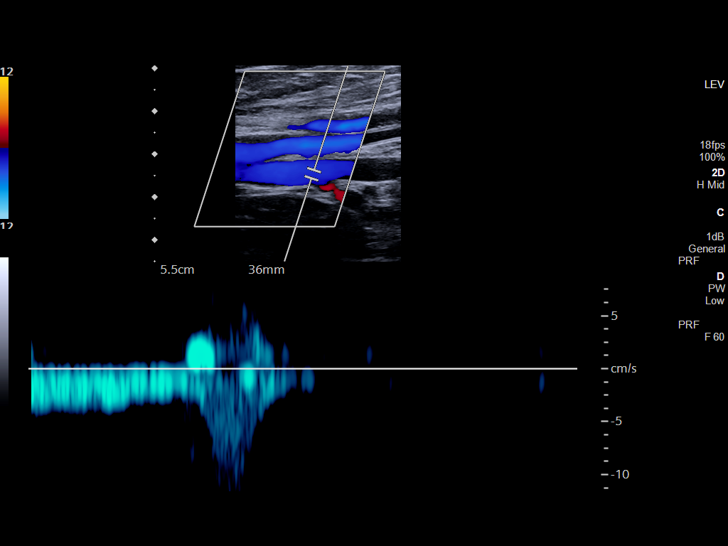
[im 18/35]
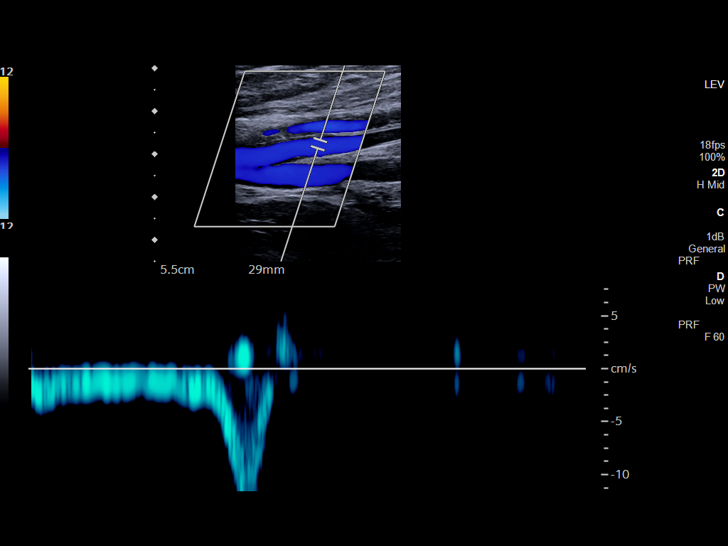
[im 20/35]
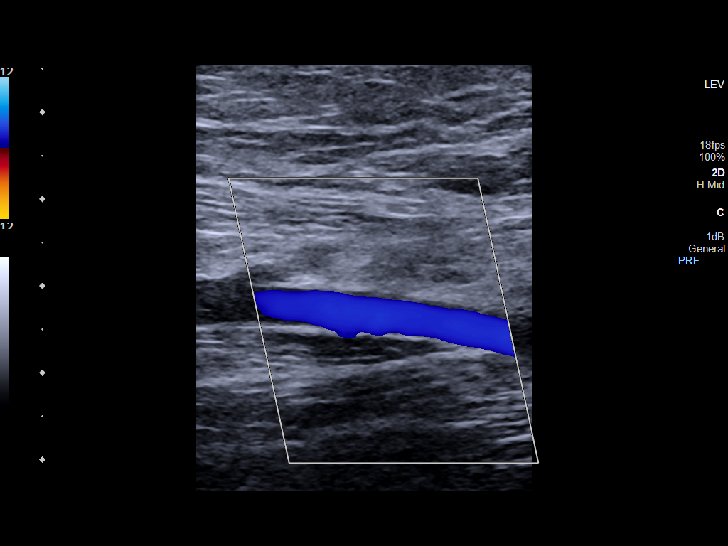
[im 23/35]
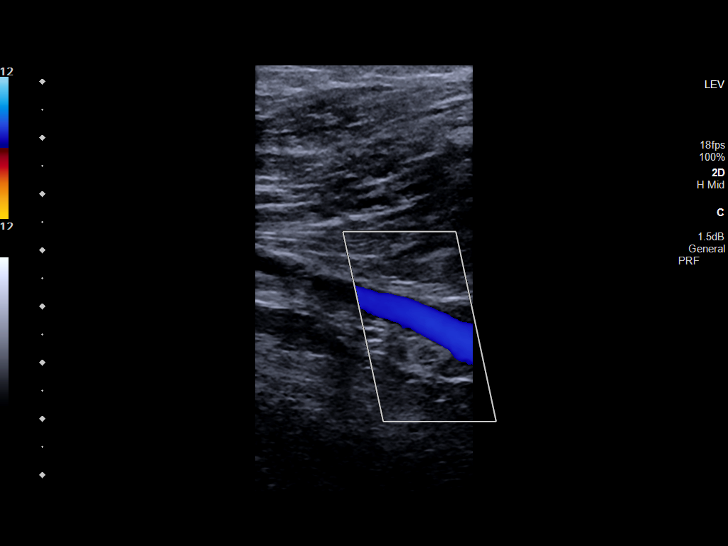
[im 26/35]
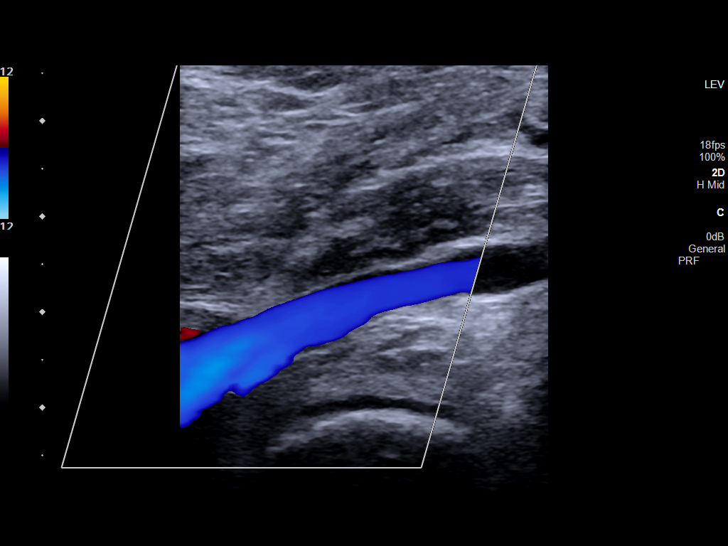
[im 29/35]
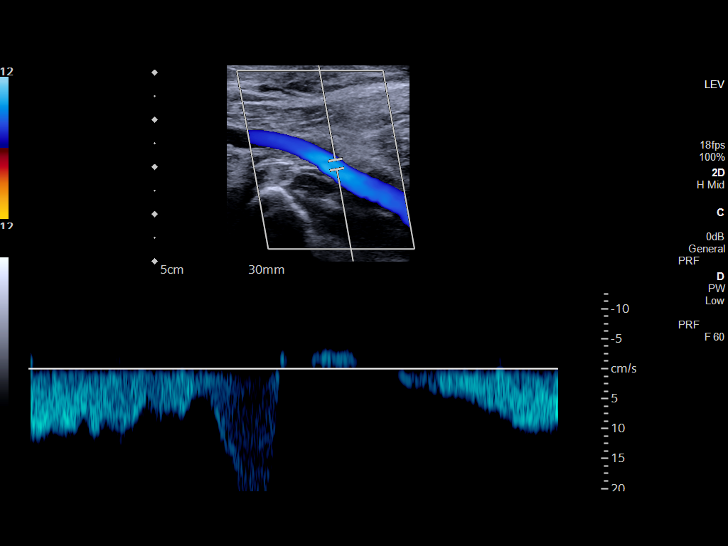
[im 32/35]
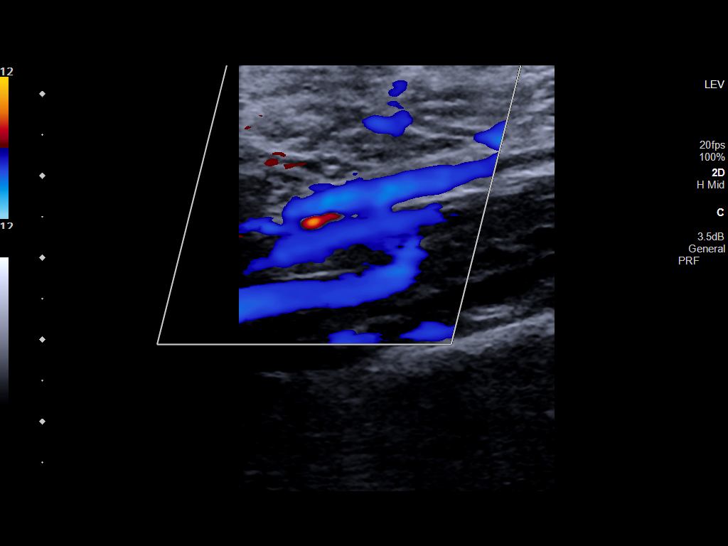
[im 35/35]
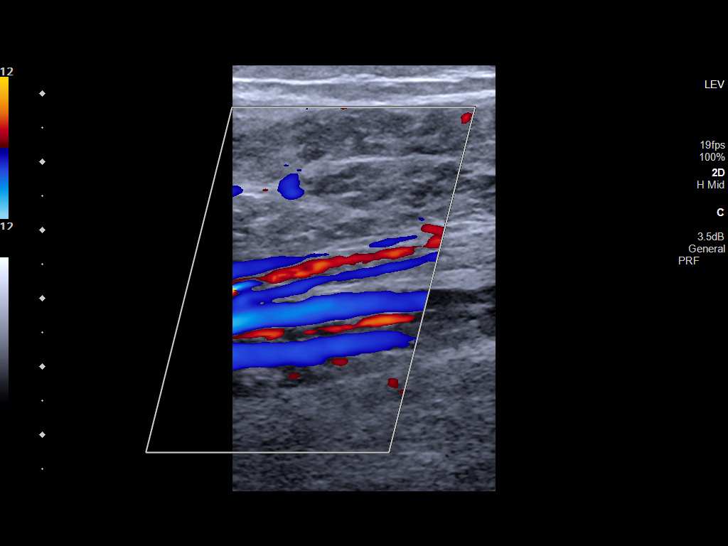

[13 of 24 positions shown; findings below may reference images not displayed]

FINDINGS: Contralateral Common Femoral Vein: Respiratory phasicity is normal
and symmetric with the symptomatic side. No evidence of thrombus.
Normal compressibility.

Common Femoral Vein: No evidence of thrombus. Normal
compressibility, respiratory phasicity and response to augmentation.

Saphenofemoral Junction: No evidence of thrombus. Normal
compressibility and flow on color Doppler imaging.

Profunda Femoral Vein: No evidence of thrombus. Normal
compressibility and flow on color Doppler imaging.

Femoral Vein: No evidence of thrombus. Normal compressibility,
respiratory phasicity and response to augmentation.

Popliteal Vein: No evidence of thrombus. Normal compressibility,
respiratory phasicity and response to augmentation.

Calf Veins: No evidence of thrombus. Normal compressibility and flow
on color Doppler imaging.

Other Findings:  None.
IMPRESSION: No evidence of deep venous thrombosis.

## 2023-07-12 ENCOUNTER — Ambulatory Visit: Attending: Orthopedic Surgery | Admitting: Physical Therapy

## 2023-07-12 DIAGNOSIS — G8929 Other chronic pain: Secondary | ICD-10-CM | POA: Diagnosis present

## 2023-07-12 DIAGNOSIS — S46012A Strain of muscle(s) and tendon(s) of the rotator cuff of left shoulder, initial encounter: Secondary | ICD-10-CM

## 2023-07-12 DIAGNOSIS — M6281 Muscle weakness (generalized): Secondary | ICD-10-CM | POA: Diagnosis present

## 2023-07-12 DIAGNOSIS — M25512 Pain in left shoulder: Secondary | ICD-10-CM | POA: Insufficient documentation

## 2023-07-12 DIAGNOSIS — M25612 Stiffness of left shoulder, not elsewhere classified: Secondary | ICD-10-CM | POA: Insufficient documentation

## 2023-07-12 DIAGNOSIS — M75102 Unspecified rotator cuff tear or rupture of left shoulder, not specified as traumatic: Secondary | ICD-10-CM

## 2023-07-12 NOTE — Therapy (Signed)
 OUTPATIENT PHYSICAL THERAPY SHOULDER EVALUATION   Patient Name: Belinda Frazier MRN: [id number] DOB:1955/05/17, 68 y.o., female Today's Date: 07/13/2023  END OF SESSION:  PT End of Session - 07/12/23 1743     Visit Number 1    Number of Visits 16    Date for PT Re-Evaluation 09/06/23    PT Start Time 1513    PT Stop Time 1604    PT Time Calculation (min) 51 min             Past Medical History:  Diagnosis Date   Anemia    Arthritis    Breast cancer (HCC) left breast   bilateral mastectomy   Chronic kidney disease    stage III   Diabetes mellitus without complication [facility])    Past Surgical History:  Procedure Laterality Date   CESAREAN SECTION  1990   x2 [id number]   CHOLECYSTECTOMY     2019   MASTECTOMY, RADICAL Bilateral 1996   TOTAL KNEE ARTHROPLASTY Left 12/21/2022   Procedure: TOTAL KNEE ARTHROPLASTY;  Surgeon: Claiborne Crew, MD;  Location: [facility];  Service: Orthopedics;  Laterality: Left;   Patient Active Problem List   Diagnosis Date Noted   S/P total knee arthroplasty, 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    PCP: Rex Castor, MD  REFERRING PROVIDER: Winston Hawking, MD  REFERRING DIAG: [phone] (ICD-10-CM) - Complete rotator cuff tear or rupture of left shoulder, not specified as traumatic   THERAPY DIAG:  Chronic left shoulder pain  Muscle weakness (generalized)  Shoulder joint stiffness, left  Rationale for Evaluation and Treatment: Rehabilitation  ONSET DATE: 8 MONTHS AGO   SUBJECTIVE:                                                                                                                                                                                      SUBJECTIVE STATEMENT: Pt tripped over dog in March and landed on L shoulder, had already been having shoulder pain prior to this incident. Imaging showed large L supraspinatus tear. Pt reports difficulties with ADLs and job responsibilities d/t shoulder injury. Pt reports occasional pins and  needles  in her hand, specifically 4th and 5th digits on L. Hx of carpal tunnel and elbow pain B.  Hand dominance: Right  PERTINENT HISTORY: Belinda Frazier is a 68 year old female who presents for pre-operative evaluation for left shoulder surgery.  She is scheduled for a reverse shoulder replacement on August 18th due to a large tear in the left shoulder rotator cuff, with two tendons completely gone and the head of the humerus displaced. She experiences significant pain and difficulty with daily activities, such as putting away dishes. No  history of falls, car accidents, or other injuries that could have caused the shoulder damage. She is also right-handed surprisingly. She is following up with [facility].  Her diabetes is well-controlled with an A1c of 5.8% as of June 25th. She is currently taking Mounjaro for diabetes management. She is also on metformin , glipizide . No history of heart issues, including chest pain or heart flutters, and no recent EKGs except for surgical clearance.  She has a history of left knee replacement surgery and is pleased with the healing of her knee surgery scar. She used meloxicam  post-knee surgery for swelling but has since stopped it. She is currently experiencing leg cramping and has been taking methocarbamol , which she finds helpful. She also takes gabapentin  regularly at bedtime.   PAIN:  Are you having pain? Yes: NPRS scale: 4/10, increases to 8/10 with movement Pain location: L shoulder Pain description: achy dull constantly, sharp pain with movmenet  Aggravating factors: shoulder movement in all planes, lifting >3#, sleeping on L side, crossing arms  Relieving factors: Tylenol  not helpful  PRECAUTIONS: Fall  RED FLAGS: None   WEIGHT BEARING RESTRICTIONS: No  FALLS:  Has patient fallen in last 6 months? Yes. Number of falls 2, tripped over dog and fell on L shoulder, stumbled on unlevel surface in yard.  LIVING ENVIRONMENT: Lives with: lives with  their family and lives with their spouse Lives in: House/apartment Stairs: Yes: External: 7 steps; bilateral but cannot reach both Has following equipment at home: Single point cane, Walker - 2 wheeled, and Ramped entry  OCCUPATION: Case Production designer, theatre/television/film for worker's comp   PLOF: Independent  PATIENT GOALS: return to full, pain free ROM of shoulder, regain functional independance  NEXT MD VISIT: September 19, 2023 for surgery   OBJECTIVE:  Note: Objective measures were completed at Evaluation unless otherwise noted.   PATIENT SURVEYS:  Quick Dash 56.8%  COGNITION: Overall cognitive status: Within functional limits for tasks assessed     SENSATION: Not tested  POSTURE: Pt demonstrates good posture overall.  UPPER EXTREMITY ROM:   Active/ Passive ROM Right eval Left eval  Shoulder flexion 185 18 p/ 130p  Shoulder extension    Shoulder abduction 161 25 p/ 55p  Shoulder adduction    Shoulder internal rotation 78 70  Shoulder external rotation 94 64  Elbow flexion    Elbow extension    Wrist flexion    Wrist extension    Wrist ulnar deviation    Wrist radial deviation    Wrist pronation    Wrist supination    (Blank rows = not tested)  Seated R/L shoulder AROM: flexion (181 deg./41 deg.), abduction (165 deg./52 deg.), ER (79 deg./63 deg.), IR (58 deg./56 deg.).  UPPER EXTREMITY MMT:  MMT Right eval Left eval  Shoulder flexion 4- 3-  Shoulder extension    Shoulder abduction 4 3-  Shoulder adduction    Shoulder internal rotation 4+ 3-  Shoulder external rotation 4+ 3-  Middle trapezius    Lower trapezius    Elbow flexion 4+ 4  Elbow extension 4+ 4- p  Wrist flexion    Wrist extension    Wrist ulnar deviation    Wrist radial deviation    Wrist pronation    Wrist supination    Grip strength (lbs) 45# 35.5#  (Blank rows = not tested)   SHOULDER SPECIAL TESTS: NT  JOINT MOBILITY TESTING:  Good, pain-free joint capsule mobility B  PALPATION:  L anterior  deltoid, L AC joint, tender to  palpation                                                                                                                              TREATMENT DATE: 07/12/23  See evaluation/ See HEP  PATIENT EDUCATION: Education details: HEP  Person educated: Patient Education method: Explanation, Demonstration, and Handouts Education comprehension: verbalized understanding and returned demonstration  HOME EXERCISE PROGRAM: Access Code: [email or web address] URL: [email or web address] Date: 07/13/2023 Prepared by: Hazeline Lister  Exercises - Seated Scapular Retraction  - 2 x daily - 5 x weekly - 1 sets - 10 reps - 5 seconds hold - Standing Isometric Shoulder Flexion with Doorway - Arm Bent  - 2 x daily - 5 x weekly - 1 sets - 10 reps - 5 seconds hold - Standing Isometric Shoulder External Rotation with Doorway  - 2 x daily - 5 x weekly - 1 sets - 10 reps - 5 seconds hold - Standing Isometric Shoulder Abduction with Doorway - Arm Bent  - 2 x daily - 5 x weekly - 1 sets - 10 reps - 5 seconds hold  ASSESSMENT:  CLINICAL IMPRESSION: Patient is a 68 y.o. F who was seen today for physical therapy evaluation and treatment for complete L shoulder RTC tear. Pt is planning a reverse shoulder replacement in August.  Pt demonstrates overall L shoulder weakness and painful, limited ROM on with occasional tingling in an ulnar nerve pattern.  Pt. Will benefit from skilled PT services to develop exercise program to address L UE limitations/ weakness to improve pain-free mobility prior to surgery.    OBJECTIVE IMPAIRMENTS: decreased ROM, decreased strength, impaired sensation, and impaired UE functional use.   ACTIVITY LIMITATIONS: carrying, lifting, bathing, toileting, dressing, reach over head, and hygiene/grooming  PARTICIPATION LIMITATIONS: driving and occupation  PERSONAL FACTORS: Age, Profession, and Time since onset of injury/illness/exacerbation are also affecting patient's  functional outcome.   REHAB POTENTIAL: Good  CLINICAL DECISION MAKING: Evolving/moderate complexity  EVALUATION COMPLEXITY: Moderate   GOALS: Goals reviewed with patient? No  SHORT TERM GOALS: Target date: 08/02/23  Pt will demonstrate independence with HEP to increase L shoulder strength 1/2 muscle grade to improve strength.   Baseline: initial visit Goal status: INITIAL  2.  Pt will report <4/10 L shoulder pain at worst with everyday tasks/ work-related tasks.   Baseline: 8/10 L shoulder pain at worst.  Goal status: INITIAL   LONG TERM GOALS: Target date: 09/06/23  Pt. Able to lift/carry 10# box with no L shoulder limitations to improve household tasks/ carrying groceries.    Baseline: L shoulder pain Goal status: INITIAL  2.  Pt will decrease Quick-DASH score to <= 40% to demonstrate improved functional capacity in L shoulder.  Baseline: 56.8% Goal status: INITIAL  3.  Pt will improve gross L shoulder strength to >= 4-/5 to improve post-op prognosis.  Baseline: see MMT  Goal status: INITIAL   PLAN:  PT FREQUENCY: 1-2x/week  PT DURATION: 8  weeks  PLANNED INTERVENTIONS: 97750- Physical Performance Testing, 97110-Therapeutic exercises, 97530- Therapeutic activity, [id number]- Neuromuscular re-education, 97535- Self Care, [id number]- Manual therapy, G0283- Electrical stimulation (unattended), Patient/Family education, Joint mobilization, Cryotherapy, and Moist heat  PLAN FOR NEXT SESSION: Assess AAROM with shoulder pulleys for home use, progress isometric exercises    Lendell Quarry, PT, DPT # [phone] Andi Kaufmann, SPT 07/13/2023, 6:16 PM

## 2023-07-14 ENCOUNTER — Ambulatory Visit: Admitting: Physical Therapy

## 2023-07-18 ENCOUNTER — Encounter: Payer: Self-pay | Admitting: Physical Therapy

## 2023-07-18 ENCOUNTER — Ambulatory Visit: Admitting: Physical Therapy

## 2023-07-18 DIAGNOSIS — G8929 Other chronic pain: Secondary | ICD-10-CM

## 2023-07-18 DIAGNOSIS — M25612 Stiffness of left shoulder, not elsewhere classified: Secondary | ICD-10-CM

## 2023-07-18 DIAGNOSIS — M6281 Muscle weakness (generalized): Secondary | ICD-10-CM

## 2023-07-18 DIAGNOSIS — M25512 Pain in left shoulder: Secondary | ICD-10-CM | POA: Diagnosis not present

## 2023-07-18 NOTE — Therapy (Signed)
 OUTPATIENT PHYSICAL THERAPY SHOULDER TREATMENT   Patient Name: Belinda Frazier MRN: [id number] DOB:1955-10-17, 68 y.o., female Today's Date: 07/19/2023  END OF SESSION:  PT End of Session - 07/18/23 1448     Visit Number 2    Number of Visits 16    Date for PT Re-Evaluation 09/06/23    PT Start Time 1448    PT Stop Time 1528    PT Time Calculation (min) 40 min          Past Medical History:  Diagnosis Date   Anemia    Arthritis    Breast cancer (HCC) left breast   bilateral mastectomy   Chronic kidney disease    stage III   Diabetes mellitus without complication [facility])    Past Surgical History:  Procedure Laterality Date   CESAREAN SECTION  1990   x2 [id number]   CHOLECYSTECTOMY     2019   MASTECTOMY, RADICAL Bilateral 1996   TOTAL KNEE ARTHROPLASTY Left 12/21/2022   Procedure: TOTAL KNEE ARTHROPLASTY;  Surgeon: Claiborne Crew, MD;  Location: [facility];  Service: Orthopedics;  Laterality: Left;   Patient Active Problem List   Diagnosis Date Noted   S/P total knee arthroplasty, 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    PCP: Rex Castor, MD  REFERRING PROVIDER: Winston Hawking, MD  REFERRING DIAG: [phone] (ICD-10-CM) - Complete rotator cuff tear or rupture of left shoulder, not specified as traumatic   THERAPY DIAG:  Chronic left shoulder pain  Muscle weakness (generalized)  Shoulder joint stiffness, left  Rationale for Evaluation and Treatment: Rehabilitation  ONSET DATE: 8 MONTHS AGO   SUBJECTIVE:                                                                                                                                                                                      SUBJECTIVE STATEMENT: Pt tripped over dog in March and landed on L shoulder, had already been having shoulder pain prior to this incident. Imaging showed large L supraspinatus tear. Pt reports difficulties with ADLs and job responsibilities d/t shoulder injury. Pt reports occasional pins and needles   in her hand, specifically 4th and 5th digits on L. Hx of carpal tunnel and elbow pain B.  Hand dominance: Right  PERTINENT HISTORY: Belinda Frazier is a 68 year old female who presents for pre-operative evaluation for left shoulder surgery.  She is scheduled for a reverse shoulder replacement on August 18th due to a large tear in the left shoulder rotator cuff, with two tendons completely gone and the head of the humerus displaced. She experiences significant pain and difficulty with daily activities, such as putting away dishes. No history of falls,  car accidents, or other injuries that could have caused the shoulder damage. She is also right-handed surprisingly. She is following up with [facility].  Her diabetes is well-controlled with an A1c of 5.8% as of June 25th. She is currently taking Mounjaro for diabetes management. She is also on metformin , glipizide . No history of heart issues, including chest pain or heart flutters, and no recent EKGs except for surgical clearance.  She has a history of left knee replacement surgery and is pleased with the healing of her knee surgery scar. She used meloxicam  post-knee surgery for swelling but has since stopped it. She is currently experiencing leg cramping and has been taking methocarbamol , which she finds helpful. She also takes gabapentin  regularly at bedtime.   PAIN:  Are you having pain? Yes: NPRS scale: 4/10, increases to 8/10 with movement Pain location: L shoulder Pain description: achy dull constantly, sharp pain with movmenet  Aggravating factors: shoulder movement in all planes, lifting >3#, sleeping on L side, crossing arms  Relieving factors: Tylenol  not helpful  PRECAUTIONS: Fall  RED FLAGS: None   WEIGHT BEARING RESTRICTIONS: No  FALLS:  Has patient fallen in last 6 months? Yes. Number of falls 2, tripped over dog and fell on L shoulder, stumbled on unlevel surface in yard.  LIVING ENVIRONMENT: Lives with: lives with their family  and lives with their spouse Lives in: House/apartment Stairs: Yes: External: 7 steps; bilateral but cannot reach both Has following equipment at home: Single point cane, Walker - 2 wheeled, and Ramped entry  OCCUPATION: Case Production designer, theatre/television/film for worker's comp   PLOF: Independent  PATIENT GOALS: return to full, pain free ROM of shoulder, regain functional independance  NEXT MD VISIT: September 19, 2023 for surgery   OBJECTIVE:  Note: Objective measures were completed at Evaluation unless otherwise noted.   PATIENT SURVEYS:  Quick Dash 56.8%  COGNITION: Overall cognitive status: Within functional limits for tasks assessed     SENSATION: Not tested  POSTURE: Pt demonstrates good posture overall.  UPPER EXTREMITY ROM:   Active/ Passive ROM Right eval Left eval  Shoulder flexion 185 18 p/ 130p  Shoulder extension    Shoulder abduction 161 25 p/ 55p  Shoulder adduction    Shoulder internal rotation 78 70  Shoulder external rotation 94 64  Elbow flexion    Elbow extension    Wrist flexion    Wrist extension    Wrist ulnar deviation    Wrist radial deviation    Wrist pronation    Wrist supination    (Blank rows = not tested)  Seated R/L shoulder AROM: flexion (181 deg./41 deg.), abduction (165 deg./52 deg.), ER (79 deg./63 deg.), IR (58 deg./56 deg.).  UPPER EXTREMITY MMT:  MMT Right eval Left eval  Shoulder flexion 4- 3-  Shoulder extension    Shoulder abduction 4 3-  Shoulder adduction    Shoulder internal rotation 4+ 3-  Shoulder external rotation 4+ 3-  Middle trapezius    Lower trapezius    Elbow flexion 4+ 4  Elbow extension 4+ 4- p  Wrist flexion    Wrist extension    Wrist ulnar deviation    Wrist radial deviation    Wrist pronation    Wrist supination    Grip strength (lbs) 45# 35.5#  (Blank rows = not tested)   SHOULDER SPECIAL TESTS: NT  JOINT MOBILITY TESTING:  Good, pain-free joint capsule mobility B  PALPATION:  L anterior deltoid, L AC  joint, tender to palpation  TREATMENT DATE: 07/19/2023  Subjective:  Pt. Reports L shoulder/soreness after HEP.  Pt. Using hot water  during shower to L shoulder.  PT instructed pt. To avoid increasing pain symptoms during HEP and use ice PRN.  There.ex:  Discussed HEP  Seated shoulder pulley: flexion (limited by pain at <90 deg.) with R UE assist.  Issued pulley for HEP.    Supine L shoulder serratus punches (assist to get into 90 deg. Of L shoulder flexion)- 12x.  Supine L shoulder at 90 deg. (Alphabet ex.)- challenged and several rest breaks.    Supine L shoulder isometrics: ER/IR/abduction/ flexion 5 sec. Holds x 10 reps.  Manual feedback.      Manual tx.:  Supine L shoulder inferior/AP/PA grade II mobs. For pain control.  Cuing to relax L shoulder.   Supine/seated L shoulder AA/PROM (flexion/ abduction)- pain limited.     PATIENT EDUCATION: Education details: HEP  Person educated: Patient Education method: Explanation, Facilities manager, and Handouts Education comprehension: verbalized understanding and returned demonstration  HOME EXERCISE PROGRAM: Access Code: [email or web address] URL: [email or web address] Date: 07/13/2023 Prepared by: Hazeline Lister  Exercises - Seated Scapular Retraction  - 2 x daily - 5 x weekly - 1 sets - 10 reps - 5 seconds hold - Standing Isometric Shoulder Flexion with Doorway - Arm Bent  - 2 x daily - 5 x weekly - 1 sets - 10 reps - 5 seconds hold - Standing Isometric Shoulder External Rotation with Doorway  - 2 x daily - 5 x weekly - 1 sets - 10 reps - 5 seconds hold - Standing Isometric Shoulder Abduction with Doorway - Arm Bent  - 2 x daily - 5 x weekly - 1 sets - 10 reps - 5 seconds hold  Access Code: [email or web address] URL: [email or web address] Date: 07/18/2023 Prepared by: Hazeline Lister  Exercises - Seated  Scapular Retraction  - 2 x daily - 5 x weekly - 1 sets - 10 reps - 5 seconds hold - Standing Isometric Shoulder Flexion with Doorway - Arm Bent  - 2 x daily - 5 x weekly - 1 sets - 10 reps - 5 seconds hold - Standing Isometric Shoulder External Rotation with Doorway  - 2 x daily - 5 x weekly - 1 sets - 10 reps - 5 seconds hold - Standing Isometric Shoulder Abduction with Doorway - Arm Bent  - 2 x daily - 5 x weekly - 1 sets - 10 reps - 5 seconds hold - Seated Shoulder Flexion AAROM with Pulley Behind  - 1 x daily - 7 x weekly - 3 sets - 10 reps - Seated Shoulder Abduction AAROM with Pulley Behind  - 1 x daily - 7 x weekly - 3 sets - 10 reps  ASSESSMENT:  CLINICAL IMPRESSION: Pt is planning a reverse shoulder replacement in August.  Pt demonstrates overall L shoulder weakness and painful with AROM (flexion/ abduction).  PT focused tx. On L shoulder ROM with use of pulley to allow pt. Control with R UE assist.  Pt. Tolerates L shoulder isometrics with manual feedback and instructed to avoid pain.  Pt. Will benefit from skilled PT services to develop exercise program to address L UE limitations/ weakness to improve pain-free mobility prior to surgery.    OBJECTIVE IMPAIRMENTS: decreased ROM, decreased strength, impaired sensation, and impaired UE functional use.   ACTIVITY LIMITATIONS: carrying, lifting, bathing, toileting, dressing, reach over head, and hygiene/grooming  PARTICIPATION LIMITATIONS: driving and occupation  PERSONAL FACTORS: Age, Profession, and Time since onset of injury/illness/exacerbation  are also affecting patient's functional outcome.   REHAB POTENTIAL: Good  CLINICAL DECISION MAKING: Evolving/moderate complexity  EVALUATION COMPLEXITY: Moderate   GOALS: Goals reviewed with patient? No  SHORT TERM GOALS: Target date: 08/02/23  Pt will demonstrate independence with HEP to increase L shoulder strength 1/2 muscle grade to improve strength.   Baseline: initial visit Goal  status: INITIAL  2.  Pt will report <4/10 L shoulder pain at worst with everyday tasks/ work-related tasks.   Baseline: 8/10 L shoulder pain at worst.  Goal status: INITIAL   LONG TERM GOALS: Target date: 09/06/23  Pt. Able to lift/carry 10# box with no L shoulder limitations to improve household tasks/ carrying groceries.    Baseline: L shoulder pain Goal status: INITIAL  2.  Pt will decrease Quick-DASH score to <= 40% to demonstrate improved functional capacity in L shoulder.  Baseline: 56.8% Goal status: INITIAL  3.  Pt will improve gross L shoulder strength to >= 4-/5 to improve post-op prognosis.  Baseline: see MMT  Goal status: INITIAL   PLAN:  PT FREQUENCY: 1-2x/week  PT DURATION: 8 weeks  PLANNED INTERVENTIONS: 97750- Physical Performance Testing, 97110-Therapeutic exercises, 97530- Therapeutic activity, [id number]- Neuromuscular re-education, 97535- Self Care, 95621- Manual therapy, G0283- Electrical stimulation (unattended), Patient/Family education, Joint mobilization, Cryotherapy, and Moist heat  PLAN FOR NEXT SESSION: progress isometric exercises/ discuss pulley ex for HEP    Lendell Quarry, PT, DPT # [id number] Andi Kaufmann, SPT 07/19/2023, 8:06 AM

## 2023-07-20 ENCOUNTER — Ambulatory Visit: Admitting: Physical Therapy

## 2023-07-25 ENCOUNTER — Ambulatory Visit: Admitting: Physical Therapy

## 2023-07-25 DIAGNOSIS — M25612 Stiffness of left shoulder, not elsewhere classified: Secondary | ICD-10-CM

## 2023-07-25 DIAGNOSIS — M6281 Muscle weakness (generalized): Secondary | ICD-10-CM

## 2023-07-25 DIAGNOSIS — G8929 Other chronic pain: Secondary | ICD-10-CM

## 2023-07-25 DIAGNOSIS — M25512 Pain in left shoulder: Secondary | ICD-10-CM | POA: Diagnosis not present

## 2023-07-25 NOTE — Therapy (Unsigned)
 OUTPATIENT PHYSICAL THERAPY SHOULDER TREATMENT   Patient Name: Belinda Frazier MRN: [id number] DOB:Feb 22, 1955, 68 y.o., female Today's Date: 07/25/2023  END OF SESSION:  PT End of Session - 07/25/23 1300     Visit Number 3    Number of Visits 16    Date for PT Re-Evaluation 09/06/23    PT Start Time 1300    PT Stop Time 1345    PT Time Calculation (min) 45 min          Past Medical History:  Diagnosis Date   Anemia    Arthritis    Breast cancer (HCC) left breast   bilateral mastectomy   Chronic kidney disease    stage III   Diabetes mellitus without complication [facility])    Past Surgical History:  Procedure Laterality Date   CESAREAN SECTION  1990   x2 [id number]   CHOLECYSTECTOMY     2019   MASTECTOMY, RADICAL Bilateral 1996   TOTAL KNEE ARTHROPLASTY Left 12/21/2022   Procedure: TOTAL KNEE ARTHROPLASTY;  Surgeon: Ernie Cough, MD;  Location: [facility];  Service: Orthopedics;  Laterality: Left;   Patient Active Problem List   Diagnosis Date Noted   S/P total knee arthroplasty, 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    PCP: Sherial Bail, MD  REFERRING PROVIDER: Kay Kemps, MD  REFERRING DIAG: [phone] (ICD-10-CM) - Complete rotator cuff tear or rupture of left shoulder, not specified as traumatic   THERAPY DIAG:  Chronic left shoulder pain  Muscle weakness (generalized)  Shoulder joint stiffness, left  Rationale for Evaluation and Treatment: Rehabilitation  ONSET DATE: 8 MONTHS AGO   SUBJECTIVE:                                                                                                                                                                                      SUBJECTIVE STATEMENT: Pt tripped over dog in March and landed on L shoulder, had already been having shoulder pain prior to this incident. Imaging showed large L supraspinatus tear. Pt reports difficulties with ADLs and job responsibilities d/t shoulder injury. Pt reports occasional pins and needles   in her hand, specifically 4th and 5th digits on L. Hx of carpal tunnel and elbow pain B.  Hand dominance: Right  PERTINENT HISTORY: [name] is a 68 year old female who presents for pre-operative evaluation for left shoulder surgery.  She is scheduled for a reverse shoulder replacement on August 18th due to a large tear in the left shoulder rotator cuff, with two tendons completely gone and the head of the humerus displaced. She experiences significant pain and difficulty with daily activities, such as putting away dishes. No history of falls,  car accidents, or other injuries that could have caused the shoulder damage. She is also right-handed surprisingly. She is following up with [facility].  Her diabetes is well-controlled with an A1c of 5.8% as of June 25th. She is currently taking Mounjaro for diabetes management. She is also on metformin , glipizide . No history of heart issues, including chest pain or heart flutters, and no recent EKGs except for surgical clearance.  She has a history of left knee replacement surgery and is pleased with the healing of her knee surgery scar. She used meloxicam  post-knee surgery for swelling but has since stopped it. She is currently experiencing leg cramping and has been taking methocarbamol , which she finds helpful. She also takes gabapentin  regularly at bedtime.   PAIN:  Are you having pain? Yes: NPRS scale: 4/10, increases to 8/10 with movement Pain location: L shoulder Pain description: achy dull constantly, sharp pain with movmenet  Aggravating factors: shoulder movement in all planes, lifting >3#, sleeping on L side, crossing arms  Relieving factors: Tylenol  not helpful  PRECAUTIONS: Fall  RED FLAGS: None   WEIGHT BEARING RESTRICTIONS: No  FALLS:  Has patient fallen in last 6 months? Yes. Number of falls 2, tripped over dog and fell on L shoulder, stumbled on unlevel surface in yard.  LIVING ENVIRONMENT: Lives with: lives with their family  and lives with their spouse Lives in: House/apartment Stairs: Yes: External: 7 steps; bilateral but cannot reach both Has following equipment at home: Single point cane, Walker - 2 wheeled, and Ramped entry  OCCUPATION: Case Production designer, theatre/television/film for worker's comp   PLOF: Independent  PATIENT GOALS: return to full, pain free ROM of shoulder, regain functional independance  NEXT MD VISIT: September 19, 2023 for surgery   OBJECTIVE:  Note: Objective measures were completed at Evaluation unless otherwise noted.   PATIENT SURVEYS:  Quick Dash 56.8%  COGNITION: Overall cognitive status: Within functional limits for tasks assessed     SENSATION: Not tested  POSTURE: Pt demonstrates good posture overall.  UPPER EXTREMITY ROM:   Active/ Passive ROM Right eval Left eval  Shoulder flexion 185 18 p/ 130p  Shoulder extension    Shoulder abduction 161 25 p/ 55p  Shoulder adduction    Shoulder internal rotation 78 70  Shoulder external rotation 94 64  Elbow flexion    Elbow extension    Wrist flexion    Wrist extension    Wrist ulnar deviation    Wrist radial deviation    Wrist pronation    Wrist supination    (Blank rows = not tested)  Seated R/L shoulder AROM: flexion (181 deg./41 deg.), abduction (165 deg./52 deg.), ER (79 deg./63 deg.), IR (58 deg./56 deg.).  UPPER EXTREMITY MMT:  MMT Right eval Left eval  Shoulder flexion 4- 3-  Shoulder extension    Shoulder abduction 4 3-  Shoulder adduction    Shoulder internal rotation 4+ 3-  Shoulder external rotation 4+ 3-  Middle trapezius    Lower trapezius    Elbow flexion 4+ 4  Elbow extension 4+ 4- p  Wrist flexion    Wrist extension    Wrist ulnar deviation    Wrist radial deviation    Wrist pronation    Wrist supination    Grip strength (lbs) 45# 35.5#  (Blank rows = not tested)   SHOULDER SPECIAL TESTS: NT  JOINT MOBILITY TESTING:  Good, pain-free joint capsule mobility B  PALPATION:  L anterior deltoid, L AC  joint, tender to palpation  TREATMENT DATE: 07/25/2023  Subjective:  Pt. Reports she went to a motorcycle rally over the weekend, did not ride any motorcycles d/t shoulder pain. Pt reports she has been using the pulleys at home, and that she feels like it helps her ROM.   There.ex:  4 min UBE f/b    Wall ladder, standing L shoulder flexion, marked with small red heart sticker.   Supine L shoulder isometrics: ER/IR/abduction/ flexion 5 sec. Holds x 12 reps.  Manual feedback.  Supine B scapular retractions - 12x  Supine L shoulder serratus punches - 12x. Supine B bicep curls with 3# dumbbells - 2x10 Standing B tricep extensions with red TB - 1x15   Manual tx.:  Supine L shoulder inferior/AP/PA grade II mobs. For pain control.     PATIENT EDUCATION: Education details: HEP  Person educated: Patient Education method: Explanation, Facilities manager, and Handouts Education comprehension: verbalized understanding and returned demonstration  HOME EXERCISE PROGRAM: Access Code: [email or web address] URL: [email or web address] Date: 07/13/2023 Prepared by: Ozell Sero  Exercises - Seated Scapular Retraction  - 2 x daily - 5 x weekly - 1 sets - 10 reps - 5 seconds hold - Standing Isometric Shoulder Flexion with Doorway - Arm Bent  - 2 x daily - 5 x weekly - 1 sets - 10 reps - 5 seconds hold - Standing Isometric Shoulder External Rotation with Doorway  - 2 x daily - 5 x weekly - 1 sets - 10 reps - 5 seconds hold - Standing Isometric Shoulder Abduction with Doorway - Arm Bent  - 2 x daily - 5 x weekly - 1 sets - 10 reps - 5 seconds hold  Access Code: [email or web address] URL: [email or web address] Date: 07/18/2023 Prepared by: Ozell Sero  Exercises - Seated Scapular Retraction  - 2 x daily - 5 x weekly - 1 sets - 10 reps - 5 seconds hold - Standing  Isometric Shoulder Flexion with Doorway - Arm Bent  - 2 x daily - 5 x weekly - 1 sets - 10 reps - 5 seconds hold - Standing Isometric Shoulder External Rotation with Doorway  - 2 x daily - 5 x weekly - 1 sets - 10 reps - 5 seconds hold - Standing Isometric Shoulder Abduction with Doorway - Arm Bent  - 2 x daily - 5 x weekly - 1 sets - 10 reps - 5 seconds hold - Seated Shoulder Flexion AAROM with Pulley Behind  - 1 x daily - 7 x weekly - 3 sets - 10 reps - Seated Shoulder Abduction AAROM with Pulley Behind  - 1 x daily - 7 x weekly - 3 sets - 10 reps  ASSESSMENT:  CLINICAL IMPRESSION: Pt is planning a reverse shoulder replacement in August.  Pt demonstrates overall L shoulder weakness and painful with AROM (flexion/ abduction).  PT focused tx. On L shoulder ROM. Pt experienced 7/10 pain with forward pedaling the UBE, did not experience pain with backward pedaling.  Pt reports increased pain when moving her L shoulder into ER/IR, also reports increased pain when lowering shoulder from maximum flexed position. Pt did not require assistance from PT to get her LUE into 90 deg. Of flexion while supine, though she had difficulty attaining the position. Pt will benefit from continued PT services to improve ROM and strength to maximize recovery outcomes following surgery.   OBJECTIVE IMPAIRMENTS: decreased ROM, decreased strength, impaired sensation, and impaired UE functional use.   ACTIVITY LIMITATIONS: carrying, lifting, bathing, toileting, dressing, reach over head, and hygiene/grooming  PARTICIPATION  LIMITATIONS: driving and occupation  PERSONAL FACTORS: Age, Profession, and Time since onset of injury/illness/exacerbation are also affecting patient's functional outcome.   REHAB POTENTIAL: Good  CLINICAL DECISION MAKING: Evolving/moderate complexity  EVALUATION COMPLEXITY: Moderate   GOALS: Goals reviewed with patient? No  SHORT TERM GOALS: Target date: 08/02/23  Pt will demonstrate  independence with HEP to increase L shoulder strength 1/2 muscle grade to improve strength.   Baseline: initial visit Goal status: INITIAL  2.  Pt will report <4/10 L shoulder pain at worst with everyday tasks/ work-related tasks.   Baseline: 8/10 L shoulder pain at worst.  Goal status: INITIAL   LONG TERM GOALS: Target date: 09/06/23  Pt. Able to lift/carry 10# box with no L shoulder limitations to improve household tasks/ carrying groceries.    Baseline: L shoulder pain Goal status: INITIAL  2.  Pt will decrease Quick-DASH score to <= 40% to demonstrate improved functional capacity in L shoulder.  Baseline: 56.8% Goal status: INITIAL  3.  Pt will improve gross L shoulder strength to >= 4-/5 to improve post-op prognosis.  Baseline: see MMT  Goal status: INITIAL   PLAN:  PT FREQUENCY: 1-2x/week  PT DURATION: 8 weeks  PLANNED INTERVENTIONS: 97750- Physical Performance Testing, 97110-Therapeutic exercises, 97530- Therapeutic activity, [id number]- Neuromuscular re-education, 97535- Self Care, 02859- Manual therapy, G0283- Electrical stimulation (unattended), Patient/Family education, Joint mobilization, Cryotherapy, and Moist heat  PLAN FOR NEXT SESSION: progress isometric exercises   Ozell JAYSON Sero, PT, DPT # [phone] Cheron Last, SPT 07/25/2023, 1:45 PM

## 2023-07-26 ENCOUNTER — Encounter: Payer: Self-pay | Admitting: Physical Therapy

## 2023-07-28 ENCOUNTER — Ambulatory Visit: Admitting: Physical Therapy

## 2023-07-28 DIAGNOSIS — M25612 Stiffness of left shoulder, not elsewhere classified: Secondary | ICD-10-CM

## 2023-07-28 DIAGNOSIS — M25512 Pain in left shoulder: Secondary | ICD-10-CM | POA: Diagnosis not present

## 2023-07-28 DIAGNOSIS — G8929 Other chronic pain: Secondary | ICD-10-CM

## 2023-07-28 DIAGNOSIS — M6281 Muscle weakness (generalized): Secondary | ICD-10-CM

## 2023-07-28 NOTE — Therapy (Signed)
 OUTPATIENT PHYSICAL THERAPY SHOULDER TREATMENT   Patient Name: Belinda Frazier MRN: [id number] DOB:1955/11/13, 68 y.o., female Today's Date: 07/28/2023  END OF SESSION:  PT End of Session - 07/28/23 1304     Visit Number 4    Number of Visits 16    Date for PT Re-Evaluation 09/06/23    PT Start Time 1304    PT Stop Time 1349    PT Time Calculation (min) 45 min          Past Medical History:  Diagnosis Date   Anemia    Arthritis    Breast cancer (HCC) left breast   bilateral mastectomy   Chronic kidney disease    stage III   Diabetes mellitus without complication [facility])    Past Surgical History:  Procedure Laterality Date   CESAREAN SECTION  1990   x2 [id number]   CHOLECYSTECTOMY     2019   MASTECTOMY, RADICAL Bilateral 1996   TOTAL KNEE ARTHROPLASTY Left 12/21/2022   Procedure: TOTAL KNEE ARTHROPLASTY;  Surgeon: Ernie Cough, MD;  Location: [facility];  Service: Orthopedics;  Laterality: Left;   Patient Active Problem List   Diagnosis Date Noted   S/P total knee arthroplasty, 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    PCP: Sherial Bail, MD  REFERRING PROVIDER: Kay Kemps, MD  REFERRING DIAG: [phone] (ICD-10-CM) - Complete rotator cuff tear or rupture of left shoulder, not specified as traumatic   THERAPY DIAG:  Chronic left shoulder pain  Muscle weakness (generalized)  Shoulder joint stiffness, left  Rationale for Evaluation and Treatment: Rehabilitation  ONSET DATE: 8 MONTHS AGO   SUBJECTIVE:                                                                                                                                                                                      SUBJECTIVE STATEMENT: Pt tripped over dog in March and landed on L shoulder, had already been having shoulder pain prior to this incident. Imaging showed large L supraspinatus tear. Pt reports difficulties with ADLs and job responsibilities d/t shoulder injury. Pt reports occasional pins and needles   in her hand, specifically 4th and 5th digits on L. Hx of carpal tunnel and elbow pain B.  Hand dominance: Right  PERTINENT HISTORY: Belinda Frazier is a 68 year old female who presents for pre-operative evaluation for left shoulder surgery.  She is scheduled for a reverse shoulder replacement on August 18th due to a large tear in the left shoulder rotator cuff, with two tendons completely gone and the head of the humerus displaced. She experiences significant pain and difficulty with daily activities, such as putting away dishes. No history of falls,  car accidents, or other injuries that could have caused the shoulder damage. She is also right-handed surprisingly. She is following up with [facility].  Her diabetes is well-controlled with an A1c of 5.8% as of June 25th. She is currently taking Mounjaro for diabetes management. She is also on metformin , glipizide . No history of heart issues, including chest pain or heart flutters, and no recent EKGs except for surgical clearance.  She has a history of left knee replacement surgery and is pleased with the healing of her knee surgery scar. She used meloxicam  post-knee surgery for swelling but has since stopped it. She is currently experiencing leg cramping and has been taking methocarbamol , which she finds helpful. She also takes gabapentin  regularly at bedtime.   PAIN:  Are you having pain? Yes: NPRS scale: 4/10, increases to 8/10 with movement Pain location: L shoulder Pain description: achy dull constantly, sharp pain with movmenet  Aggravating factors: shoulder movement in all planes, lifting >3#, sleeping on L side, crossing arms  Relieving factors: Tylenol  not helpful  PRECAUTIONS: Fall  RED FLAGS: None   WEIGHT BEARING RESTRICTIONS: No  FALLS:  Has patient fallen in last 6 months? Yes. Number of falls 2, tripped over dog and fell on L shoulder, stumbled on unlevel surface in yard.  LIVING ENVIRONMENT: Lives with: lives with their family  and lives with their spouse Lives in: House/apartment Stairs: Yes: External: 7 steps; bilateral but cannot reach both Has following equipment at home: Single point cane, Walker - 2 wheeled, and Ramped entry  OCCUPATION: Case Production designer, theatre/television/film for worker's comp   PLOF: Independent  PATIENT GOALS: return to full, pain free ROM of shoulder, regain functional independance  NEXT MD VISIT: September 19, 2023 for surgery   OBJECTIVE:  Note: Objective measures were completed at Evaluation unless otherwise noted.   PATIENT SURVEYS:  Quick Dash 56.8%  COGNITION: Overall cognitive status: Within functional limits for tasks assessed     SENSATION: Not tested  POSTURE: Pt demonstrates good posture overall.  UPPER EXTREMITY ROM:   Active/ Passive ROM Right eval Left eval  Shoulder flexion 185 18 p/ 130p  Shoulder extension    Shoulder abduction 161 25 p/ 55p  Shoulder adduction    Shoulder internal rotation 78 70  Shoulder external rotation 94 64  Elbow flexion    Elbow extension    Wrist flexion    Wrist extension    Wrist ulnar deviation    Wrist radial deviation    Wrist pronation    Wrist supination    (Blank rows = not tested)  Seated R/L shoulder AROM: flexion (181 deg./41 deg.), abduction (165 deg./52 deg.), ER (79 deg./63 deg.), IR (58 deg./56 deg.).  UPPER EXTREMITY MMT:  MMT Right eval Left eval  Shoulder flexion 4- 3-  Shoulder extension    Shoulder abduction 4 3-  Shoulder adduction    Shoulder internal rotation 4+ 3-  Shoulder external rotation 4+ 3-  Middle trapezius    Lower trapezius    Elbow flexion 4+ 4  Elbow extension 4+ 4- p  Wrist flexion    Wrist extension    Wrist ulnar deviation    Wrist radial deviation    Wrist pronation    Wrist supination    Grip strength (lbs) 45# 35.5#  (Blank rows = not tested)   SHOULDER SPECIAL TESTS: NT  JOINT MOBILITY TESTING:  Good, pain-free joint capsule mobility B  PALPATION:  L anterior deltoid, L AC  joint, tender to palpation  TREATMENT DATE: 07/28/2023  Subjective:  Pt. Reports she's having the same pulling sensation in her shoulder that she's been having, also reports stiffness on the R side of her neck from sleeping on it funny a few days ago.   There.ex:  4 min UBE f/b (light PT assist on L to encourage proper movement).    Wall ladder, standing L shoulder flexion, marked with small red heart sticker.   Supine overhead shoulder flexion with dowel rod x10  Supine L shoulder isometrics: ER/IR/abduction/ flexion 5 sec. Holds x 10 reps.  Manual feedback.  Supine B scapular retractions - 10x  Supine L shoulder serratus punches - 10x. Supine B bicep curls with 3# dumbbells - 2x10 Standing B tricep extensions with green TB - 1x15   Manual tx.:  Supine L shoulder inferior/AP/PA grade II mobs. For pain control.     PATIENT EDUCATION: Education details: HEP  Person educated: Patient Education method: Explanation, Facilities manager, and Handouts Education comprehension: verbalized understanding and returned demonstration  HOME EXERCISE PROGRAM: Access Code: [email or web address] URL: [email or web address] Date: 07/13/2023 Prepared by: Ozell Sero  Exercises - Seated Scapular Retraction  - 2 x daily - 5 x weekly - 1 sets - 10 reps - 5 seconds hold - Standing Isometric Shoulder Flexion with Doorway - Arm Bent  - 2 x daily - 5 x weekly - 1 sets - 10 reps - 5 seconds hold - Standing Isometric Shoulder External Rotation with Doorway  - 2 x daily - 5 x weekly - 1 sets - 10 reps - 5 seconds hold - Standing Isometric Shoulder Abduction with Doorway - Arm Bent  - 2 x daily - 5 x weekly - 1 sets - 10 reps - 5 seconds hold  Access Code: [email or web address] URL: [email or web address] Date: 07/18/2023 Prepared by: Ozell Sero  Exercises - Seated Scapular  Retraction  - 2 x daily - 5 x weekly - 1 sets - 10 reps - 5 seconds hold - Standing Isometric Shoulder Flexion with Doorway - Arm Bent  - 2 x daily - 5 x weekly - 1 sets - 10 reps - 5 seconds hold - Standing Isometric Shoulder External Rotation with Doorway  - 2 x daily - 5 x weekly - 1 sets - 10 reps - 5 seconds hold - Standing Isometric Shoulder Abduction with Doorway - Arm Bent  - 2 x daily - 5 x weekly - 1 sets - 10 reps - 5 seconds hold - Seated Shoulder Flexion AAROM with Pulley Behind  - 1 x daily - 7 x weekly - 3 sets - 10 reps - Seated Shoulder Abduction AAROM with Pulley Behind  - 1 x daily - 7 x weekly - 3 sets - 10 reps  ASSESSMENT:  CLINICAL IMPRESSION: Pt is planning a reverse shoulder replacement in August.  Pt demonstrates overall L shoulder weakness and painful with AROM (flexion/ abduction) and AAROM, especially at ~90 deg of flexion.  PT focused tx. On L shoulder ROM. Pt demonstrated increased weakness with resisted isometric exercises compared to last session. Pt had L shoulder pain with overhead shoulder flexion assisted by a dowel rod, but this pain reduced with repeated movements.  Pt will benefit from continued PT services to improve ROM and strength to maximize recovery outcomes following surgery.   OBJECTIVE IMPAIRMENTS: decreased ROM, decreased strength, impaired sensation, and impaired UE functional use.   ACTIVITY LIMITATIONS: carrying, lifting, bathing, toileting, dressing, reach over head, and hygiene/grooming  PARTICIPATION LIMITATIONS: driving and occupation  PERSONAL FACTORS: Age,  Profession, and Time since onset of injury/illness/exacerbation are also affecting patient's functional outcome.   REHAB POTENTIAL: Good  CLINICAL DECISION MAKING: Evolving/moderate complexity  EVALUATION COMPLEXITY: Moderate   GOALS: Goals reviewed with patient? No  SHORT TERM GOALS: Target date: 08/02/23  Pt will demonstrate independence with HEP to increase L shoulder  strength 1/2 muscle grade to improve strength.   Baseline: initial visit Goal status: INITIAL  2.  Pt will report <4/10 L shoulder pain at worst with everyday tasks/ work-related tasks.   Baseline: 8/10 L shoulder pain at worst.  Goal status: INITIAL   LONG TERM GOALS: Target date: 09/06/23  Pt. Able to lift/carry 10# box with no L shoulder limitations to improve household tasks/ carrying groceries.    Baseline: L shoulder pain Goal status: INITIAL  2.  Pt will decrease Quick-DASH score to <= 40% to demonstrate improved functional capacity in L shoulder.  Baseline: 56.8% Goal status: INITIAL  3.  Pt will improve gross L shoulder strength to >= 4-/5 to improve post-op prognosis.  Baseline: see MMT  Goal status: INITIAL   PLAN:  PT FREQUENCY: 1-2x/week  PT DURATION: 8 weeks  PLANNED INTERVENTIONS: 97750- Physical Performance Testing, 97110-Therapeutic exercises, 97530- Therapeutic activity, [id number]- Neuromuscular re-education, 97535- Self Care, 02859- Manual therapy, G0283- Electrical stimulation (unattended), Patient/Family education, Joint mobilization, Cryotherapy, and Moist heat  PLAN FOR NEXT SESSION: progress isometric exercises/ CHECK STGs   Ozell JAYSON Sero, PT, DPT # [phone] Cheron Last, SPT 07/28/2023, 1:49 PM

## 2023-08-01 ENCOUNTER — Encounter: Admitting: Physical Therapy

## 2023-08-04 ENCOUNTER — Ambulatory Visit: Attending: Orthopedic Surgery | Admitting: Physical Therapy

## 2023-08-04 ENCOUNTER — Encounter: Payer: Self-pay | Admitting: Physical Therapy

## 2023-08-04 DIAGNOSIS — M25562 Pain in left knee: Secondary | ICD-10-CM | POA: Insufficient documentation

## 2023-08-04 DIAGNOSIS — M25662 Stiffness of left knee, not elsewhere classified: Secondary | ICD-10-CM | POA: Insufficient documentation

## 2023-08-04 DIAGNOSIS — R269 Unspecified abnormalities of gait and mobility: Secondary | ICD-10-CM | POA: Diagnosis present

## 2023-08-04 DIAGNOSIS — Z96652 Presence of left artificial knee joint: Secondary | ICD-10-CM | POA: Insufficient documentation

## 2023-08-04 DIAGNOSIS — G8929 Other chronic pain: Secondary | ICD-10-CM | POA: Insufficient documentation

## 2023-08-04 DIAGNOSIS — M25612 Stiffness of left shoulder, not elsewhere classified: Secondary | ICD-10-CM | POA: Diagnosis present

## 2023-08-04 DIAGNOSIS — M25512 Pain in left shoulder: Secondary | ICD-10-CM | POA: Diagnosis present

## 2023-08-04 DIAGNOSIS — M6281 Muscle weakness (generalized): Secondary | ICD-10-CM | POA: Diagnosis present

## 2023-08-04 NOTE — Therapy (Signed)
 OUTPATIENT PHYSICAL THERAPY SHOULDER TREATMENT   Patient Name: Belinda Frazier MRN: [id number] DOB:Feb 07, 1955, 68 y.o., female Today's Date: 08/04/2023  END OF SESSION:  PT End of Session - 08/04/23 1303     Visit Number 5    Number of Visits 16    Date for PT Re-Evaluation 09/06/23    PT Start Time 1303    PT Stop Time 1352    PT Time Calculation (min) 49 min          Past Medical History:  Diagnosis Date   Anemia    Arthritis    Breast cancer (HCC) left breast   bilateral mastectomy   Chronic kidney disease    stage III   Diabetes mellitus without complication [facility])    Past Surgical History:  Procedure Laterality Date   CESAREAN SECTION  1990   x2 [id number]   CHOLECYSTECTOMY     2019   MASTECTOMY, RADICAL Bilateral 1996   TOTAL KNEE ARTHROPLASTY Left 12/21/2022   Procedure: TOTAL KNEE ARTHROPLASTY;  Surgeon: Belinda Cough, MD;  Location: [facility];  Service: Orthopedics;  Laterality: Left;   Patient Active Problem List   Diagnosis Date Noted   S/P total knee arthroplasty, 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    PCP: Belinda Bail, MD  REFERRING PROVIDER: Kay Kemps, MD  REFERRING DIAG: [phone] (ICD-10-CM) - Complete rotator cuff tear or rupture of left shoulder, not specified as traumatic   THERAPY DIAG:  Chronic left shoulder pain  Muscle weakness (generalized)  Shoulder joint stiffness, left  Hx of total knee arthroplasty, left  Joint stiffness of knee, left  Acute pain of left knee  Gait difficulty  Rationale for Evaluation and Treatment: Rehabilitation  ONSET DATE: 8 MONTHS AGO   SUBJECTIVE:                                                                                                                                                                                      SUBJECTIVE STATEMENT: Pt tripped over dog in March and landed on L shoulder, had already been having shoulder pain prior to this incident. Imaging showed large L supraspinatus tear. Pt  reports difficulties with ADLs and job responsibilities d/t shoulder injury. Pt reports occasional pins and needles  in her hand, specifically 4th and 5th digits on L. Hx of carpal tunnel and elbow pain B.  Hand dominance: Right  PERTINENT HISTORY: Belinda Frazier is a 68 year old female who presents for pre-operative evaluation for left shoulder surgery.  She is scheduled for a reverse shoulder replacement on August 18th due to a large tear in the left shoulder rotator cuff, with two tendons completely gone and the head  of the humerus displaced. She experiences significant pain and difficulty with daily activities, such as putting away dishes. No history of falls, car accidents, or other injuries that could have caused the shoulder damage. She is also right-handed surprisingly. She is following up with [facility].  Her diabetes is well-controlled with an A1c of 5.8% as of June 25th. She is currently taking Mounjaro for diabetes management. She is also on metformin , glipizide . No history of heart issues, including chest pain or heart flutters, and no recent EKGs except for surgical clearance.  She has a history of left knee replacement surgery and is pleased with the healing of her knee surgery scar. She used meloxicam  post-knee surgery for swelling but has since stopped it. She is currently experiencing leg cramping and has been taking methocarbamol , which she finds helpful. She also takes gabapentin  regularly at bedtime.   PAIN:  Are you having pain? Yes: NPRS scale: 4/10, increases to 8/10 with movement Pain location: L shoulder Pain description: achy dull constantly, sharp pain with movmenet  Aggravating factors: shoulder movement in all planes, lifting >3#, sleeping on L side, crossing arms  Relieving factors: Tylenol  not helpful  PRECAUTIONS: Fall  RED FLAGS: None   WEIGHT BEARING RESTRICTIONS: No  FALLS:  Has patient fallen in last 6 months? Yes. Number of falls 2, tripped over dog  and fell on L shoulder, stumbled on unlevel surface in yard.  LIVING ENVIRONMENT: Lives with: lives with their family and lives with their spouse Lives in: House/apartment Stairs: Yes: External: 7 steps; bilateral but cannot reach both Has following equipment at home: Single point cane, Walker - 2 wheeled, and Ramped entry  OCCUPATION: Case Production designer, theatre/television/film for worker's comp   PLOF: Independent  PATIENT GOALS: return to full, pain free ROM of shoulder, regain functional independance  NEXT MD VISIT: September 19, 2023 for surgery   OBJECTIVE:  Note: Objective measures were completed at Evaluation unless otherwise noted.   PATIENT SURVEYS:  Quick Dash 56.8%  COGNITION: Overall cognitive status: Within functional limits for tasks assessed     SENSATION: Not tested  POSTURE: Pt demonstrates good posture overall.  UPPER EXTREMITY ROM:   Active/ Passive ROM Right eval Left eval  Shoulder flexion 185 18 p/ 130p  Shoulder extension    Shoulder abduction 161 25 p/ 55p  Shoulder adduction    Shoulder internal rotation 78 70  Shoulder external rotation 94 64  Elbow flexion    Elbow extension    Wrist flexion    Wrist extension    Wrist ulnar deviation    Wrist radial deviation    Wrist pronation    Wrist supination    (Blank rows = not tested)  Seated R/L shoulder AROM: flexion (181 deg./41 deg.), abduction (165 deg./52 deg.), ER (79 deg./63 deg.), IR (58 deg./56 deg.).  UPPER EXTREMITY MMT:  MMT Right eval Left eval  Shoulder flexion 4- 3-  Shoulder extension    Shoulder abduction 4 3-  Shoulder adduction    Shoulder internal rotation 4+ 3-  Shoulder external rotation 4+ 3-  Middle trapezius    Lower trapezius    Elbow flexion 4+ 4  Elbow extension 4+ 4- p  Wrist flexion    Wrist extension    Wrist ulnar deviation    Wrist radial deviation    Wrist pronation    Wrist supination    Grip strength (lbs) 45# 35.5#  (Blank rows = not tested)   SHOULDER SPECIAL  TESTS: NT  JOINT MOBILITY  TESTING:  Good, pain-free joint capsule mobility B  PALPATION:  L anterior deltoid, L AC joint, tender to palpation                                                                                                                              TREATMENT DATE: 08/04/2023  Subjective:  Pt. Reports 4/10 pain in her L shoulder with activity today. Pt reports that she did a lot of shoulder ROM exercises in the pool while she was at the beach, and that they only flared up her pain a little bit. Pt reports she does not have access to a pool at home. Pt reports she is still having shoulder pain when elevating and lowering her arm to 90 deg.   There.ex:  4 min UBE f/b (light PT assist on L to encourage proper movement).    Wall ladder, standing L shoulder flexion, marked with small red heart sticker. Pain when elevating and lowering around 90 deg.   UPPER EXTREMITY MMT:  MMT Right eval Left eval  Shoulder flexion 4+ 3- p unable to achieve 90deg  Shoulder extension    Shoulder abduction 4 3- p   Shoulder adduction    Shoulder internal rotation 4+ 3-  Shoulder external rotation 4+ 3+  Middle trapezius    Lower trapezius    Elbow flexion 4+ 3, p   Elbow extension 4+ 4  p  Wrist flexion    Wrist extension    Wrist ulnar deviation    Wrist radial deviation    Wrist pronation    Wrist supination    Grip strength (lbs) 46.1# 35.3#  (Blank rows = not tested)   Supine L shoulder isometrics: ER/IR/abduction/ flexion 5 sec. Holds x 10 reps.  Manual feedback.   Pt reported noticing that her L forearm had atrophied slightly, PT gave her green theraputty and showed her how to use it.   Manual tx.: Supine L shoulder inferior/AP/PA grade II mobs for pain control. 2x20 sec    STM to L upper traps. Significant tightness noted.   Not today: washcloth on wall, sidelying scap mobs, AAROM with rhythmic stabilizations,   PATIENT EDUCATION: Education details: HEP  Person  educated: Patient Education method: Explanation, Demonstration, and Handouts Education comprehension: verbalized understanding and returned demonstration  HOME EXERCISE PROGRAM: Access Code: [email or web address] URL: [email or web address] Date: 07/13/2023 Prepared by: Ozell Sero  Exercises - Seated Scapular Retraction  - 2 x daily - 5 x weekly - 1 sets - 10 reps - 5 seconds hold - Standing Isometric Shoulder Flexion with Doorway - Arm Bent  - 2 x daily - 5 x weekly - 1 sets - 10 reps - 5 seconds hold - Standing Isometric Shoulder External Rotation with Doorway  - 2 x daily - 5 x weekly - 1 sets - 10 reps - 5 seconds hold - Standing Isometric Shoulder Abduction with Doorway - Arm Bent  - 2 x daily - 5  x weekly - 1 sets - 10 reps - 5 seconds hold  Access Code: [email or web address] URL: [email or web address] Date: 07/18/2023 Prepared by: Ozell Sero  Exercises - Seated Scapular Retraction  - 2 x daily - 5 x weekly - 1 sets - 10 reps - 5 seconds hold - Standing Isometric Shoulder Flexion with Doorway - Arm Bent  - 2 x daily - 5 x weekly - 1 sets - 10 reps - 5 seconds hold - Standing Isometric Shoulder External Rotation with Doorway  - 2 x daily - 5 x weekly - 1 sets - 10 reps - 5 seconds hold - Standing Isometric Shoulder Abduction with Doorway - Arm Bent  - 2 x daily - 5 x weekly - 1 sets - 10 reps - 5 seconds hold - Seated Shoulder Flexion AAROM with Pulley Behind  - 1 x daily - 7 x weekly - 3 sets - 10 reps - Seated Shoulder Abduction AAROM with Pulley Behind  - 1 x daily - 7 x weekly - 3 sets - 10 reps  ASSESSMENT:  CLINICAL IMPRESSION: Pt is planning a reverse shoulder replacement in August.  Pt Tx focused on re-assessing goals today.  Pt demonstrates overall L shoulder weakness and painful with AROM (flexion/ abduction) and AAROM, especially at ~90 deg of flexion. Pt was TTP with all joint mobs. Pt demonstrated ongoing L shoulder weakness and increased pain with MMT.  Specifically, she had pain with L shoulder flexion (unable to reach 90 deg.), abduction (7/10 NPS with movement), elbow flexion (unable to achieve full ROM, limited by pain), and mild pain with elbow extension. Resisted L shoulder ER/IR were pain free. Pt also demonstrated weaker L grip strength compared to eval. Significant tightness noted with STM to L upper traps.  Pt will benefit from continued PT services to improve ROM and strength to maximize recovery outcomes following surgery.   OBJECTIVE IMPAIRMENTS: decreased ROM, decreased strength, impaired sensation, and impaired UE functional use.   ACTIVITY LIMITATIONS: carrying, lifting, bathing, toileting, dressing, reach over head, and hygiene/grooming  PARTICIPATION LIMITATIONS: driving and occupation  PERSONAL FACTORS: Age, Profession, and Time since onset of injury/illness/exacerbation are also affecting patient's functional outcome.   REHAB POTENTIAL: Good  CLINICAL DECISION MAKING: Evolving/moderate complexity  EVALUATION COMPLEXITY: Moderate   GOALS: Goals reviewed with patient? No  SHORT TERM GOALS: Target date: 08/02/23  Pt will demonstrate independence with HEP to increase L shoulder strength 1/2 muscle grade to improve strength.   Baseline: initial visit. 7/3: see above  Goal status: not met  2.  Pt will report <4/10 L shoulder pain at worst with everyday tasks/ work-related tasks.   Baseline: 8/10 L shoulder pain at worst. 7/3: 4/10 pain today  Goal status: in progress   LONG TERM GOALS: Target date: 09/06/23  Pt. Able to lift/carry 10# box with no L shoulder limitations to improve household tasks/ carrying groceries.    Baseline: L shoulder pain Goal status: INITIAL  2.  Pt will decrease Quick-DASH score to <= 40% to demonstrate improved functional capacity in L shoulder.  Baseline: 56.8% Goal status: INITIAL  3.  Pt will improve gross L shoulder strength to >= 4-/5 to improve post-op prognosis.  Baseline: see MMT   Goal status: INITIAL   PLAN:  PT FREQUENCY: 1-2x/week  PT DURATION: 8 weeks  PLANNED INTERVENTIONS: 97750- Physical Performance Testing, 97110-Therapeutic exercises, 97530- Therapeutic activity, [id number]- Neuromuscular re-education, 97535- Self Care, [id number]- Manual therapy, G0283- Electrical stimulation (unattended), Patient/Family education, Joint mobilization, Cryotherapy, and Moist  heat  PLAN FOR NEXT SESSION: isometric exercises in seated, washcloth on wall, STM to L upper traps, sidelying or seated L scapular mobs, AAROM with rhythmic stabilizations    Ozell JAYSON Sero, PT, DPT # [phone] Cheron Last, SPT 08/04/2023, 4:29 PM

## 2023-08-08 ENCOUNTER — Ambulatory Visit: Admitting: Physical Therapy

## 2023-08-11 ENCOUNTER — Ambulatory Visit: Admitting: Physical Therapy

## 2023-08-11 DIAGNOSIS — M25512 Pain in left shoulder: Secondary | ICD-10-CM | POA: Diagnosis not present

## 2023-08-11 DIAGNOSIS — M6281 Muscle weakness (generalized): Secondary | ICD-10-CM

## 2023-08-11 DIAGNOSIS — Z96652 Presence of left artificial knee joint: Secondary | ICD-10-CM

## 2023-08-11 DIAGNOSIS — R269 Unspecified abnormalities of gait and mobility: Secondary | ICD-10-CM

## 2023-08-11 DIAGNOSIS — M25562 Pain in left knee: Secondary | ICD-10-CM

## 2023-08-11 DIAGNOSIS — G8929 Other chronic pain: Secondary | ICD-10-CM

## 2023-08-11 DIAGNOSIS — M25662 Stiffness of left knee, not elsewhere classified: Secondary | ICD-10-CM

## 2023-08-11 DIAGNOSIS — M25612 Stiffness of left shoulder, not elsewhere classified: Secondary | ICD-10-CM

## 2023-08-11 NOTE — Therapy (Signed)
 OUTPATIENT PHYSICAL THERAPY SHOULDER TREATMENT  Patient Name: Belinda Frazier MRN: [id number] DOB:[birth year], 68 y.o., female Today's Date: 08/11/2023  END OF SESSION:  PT End of Session - 08/11/23 1304     Visit Number 6    Number of Visits 16    Date for PT Re-Evaluation 09/06/23    PT Start Time 1304    PT Stop Time 1347    PT Time Calculation (min) 43 min          Past Medical History:  Diagnosis Date   Anemia    Arthritis    Breast cancer (HCC) left breast   bilateral mastectomy   Chronic kidney disease    stage III   Diabetes mellitus without complication Belinda Frazier)    Past Surgical History:  Procedure Laterality Date   CESAREAN SECTION  1990   x2 [id number]   CHOLECYSTECTOMY     2019   MASTECTOMY, RADICAL Bilateral 1996   TOTAL KNEE ARTHROPLASTY Left 12/21/2022   Procedure: TOTAL KNEE ARTHROPLASTY;  Surgeon: Belinda Cough, MD;  Location: [facility];  Service: Orthopedics;  Laterality: Left;   Patient Active Problem List   Diagnosis Date Noted   S/P total knee arthroplasty, 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    PCP: Belinda Bail, MD  REFERRING PROVIDER: Kay Kemps, MD  REFERRING DIAG: [phone] (ICD-10-CM) - Complete rotator cuff tear or rupture of left shoulder, not specified as traumatic   THERAPY DIAG:  Chronic left shoulder pain  Hx of total knee arthroplasty, left  Gait difficulty  Joint stiffness of knee, left  Muscle weakness (generalized)  Shoulder joint stiffness, left  Acute pain of left knee  Rationale for Evaluation and Treatment: Rehabilitation  ONSET DATE: 8 MONTHS AGO   SUBJECTIVE:                                                                                                                                                                                      SUBJECTIVE STATEMENT: Pt tripped over dog in March and landed on L shoulder, had already been having shoulder pain prior to this incident. Imaging showed large L supraspinatus tear. Pt  reports difficulties with ADLs and job responsibilities d/t shoulder injury. Pt reports occasional pins and needles  in her hand, specifically 4th and 5th digits on L. Hx of carpal tunnel and elbow pain B.  Hand dominance: Right  PERTINENT HISTORY: Belinda Frazier is a 68 year old female who presents for pre-operative evaluation for left shoulder surgery.  She is scheduled for a reverse shoulder replacement on August 18th due to a large tear in the left shoulder rotator cuff, with two tendons completely gone and the head of  the humerus displaced. She experiences significant pain and difficulty with daily activities, such as putting away dishes. No history of falls, car accidents, or other injuries that could have caused the shoulder damage. She is also right-handed surprisingly. She is following up with [facility].  Her diabetes is well-controlled with an A1c of 5.8% as of June 25th. She is currently taking Mounjaro for diabetes management. She is also on metformin , glipizide . No history of heart issues, including chest pain or heart flutters, and no recent EKGs except for surgical clearance.  She has a history of left knee replacement surgery and is pleased with the healing of her knee surgery scar. She used meloxicam  post-knee surgery for swelling but has since stopped it. She is currently experiencing leg cramping and has been taking methocarbamol , which she finds helpful. She also takes gabapentin  regularly at bedtime.   PAIN:  Are you having pain? Yes: NPRS scale: 4/10, increases to 8/10 with movement Pain location: L shoulder Pain description: achy dull constantly, sharp pain with movmenet  Aggravating factors: shoulder movement in all planes, lifting >3#, sleeping on L side, crossing arms  Relieving factors: Tylenol  not helpful  PRECAUTIONS: Fall  RED FLAGS: None   WEIGHT BEARING RESTRICTIONS: No  FALLS:  Has patient fallen in last 6 months? Yes. Number of falls 2, tripped over dog  and fell on L shoulder, stumbled on unlevel surface in yard.  LIVING ENVIRONMENT: Lives with: lives with their family and lives with their spouse Lives in: House/apartment Stairs: Yes: External: 7 steps; bilateral but cannot reach both Has following equipment at home: Single point cane, Walker - 2 wheeled, and Ramped entry  OCCUPATION: Case Production designer, theatre/television/film for worker's comp   PLOF: Independent  PATIENT GOALS: return to full, pain free ROM of shoulder, regain functional independance  NEXT MD VISIT: September 19, 2023 for surgery   OBJECTIVE:  Note: Objective measures were completed at Evaluation unless otherwise noted.   PATIENT SURVEYS:  Quick Dash 56.8%  COGNITION: Overall cognitive status: Within functional limits for tasks assessed     SENSATION: Not tested  POSTURE: Pt demonstrates good posture overall.  UPPER EXTREMITY ROM:   Active/ Passive ROM Right eval Left eval  Shoulder flexion 185 18 p/ 130p  Shoulder extension    Shoulder abduction 161 25 p/ 55p  Shoulder adduction    Shoulder internal rotation 78 70  Shoulder external rotation 94 64  Elbow flexion    Elbow extension    Wrist flexion    Wrist extension    Wrist ulnar deviation    Wrist radial deviation    Wrist pronation    Wrist supination    (Blank rows = not tested)  Seated R/L shoulder AROM: flexion (181 deg./41 deg.), abduction (165 deg./52 deg.), ER (79 deg./63 deg.), IR (58 deg./56 deg.).  UPPER EXTREMITY MMT:  MMT Right eval Left eval  Shoulder flexion 4- 3-  Shoulder extension    Shoulder abduction 4 3-  Shoulder adduction    Shoulder internal rotation 4+ 3-  Shoulder external rotation 4+ 3-  Middle trapezius    Lower trapezius    Elbow flexion 4+ 4  Elbow extension 4+ 4- p  Wrist flexion    Wrist extension    Wrist ulnar deviation    Wrist radial deviation    Wrist pronation    Wrist supination    Grip strength (lbs) 45# 35.5#  (Blank rows = not tested)   SHOULDER SPECIAL  TESTS: NT  JOINT MOBILITY TESTING:  Good, pain-free joint capsule mobility B  PALPATION:  L anterior deltoid, L AC joint, tender to palpation                                                                                                                              TREATMENT DATE: 08/11/2023  Subjective:  Pt. Reports 6/10 pain in her L shoulder today. Pt reports she has occasionally needed to take a Tylenol  to fall asleep at night. Pt also reports that she experiences L shoulder pain at rest now, as opposed to previously when she only experienced shoulder pain with movement.    There.ex: 4 min UBE f/b (discussed pain symptoms).     Wall ladder, standing L shoulder flexion, marked with small red heart sticker. Pain when elevating and lowering around 90 deg.   Supine L shoulder isometrics: ER/IR/abduction/ flexion 5 sec. Holds x 10 reps.  Manual feedback.   3x AAROM standing overhead shoulder flexion with dowel. Min A from PT to L elbow for elevation. Pt unable to achieve full ROM, limited by pain.   2x 30 sec L shoulder circles on wall with washcloth. Pt required mod A from PT at the elbow. D/c due to pain.   Manual tx.: Supine L shoulder inferior/AP/PA grade II mobs for pain control. 2x20 sec    STM to L upper traps. Significant tightness noted, 5 min   Not today:  sidelying scap mobs, AAROM with rhythmic stabilizations,   PATIENT EDUCATION: Education details: HEP  Person educated: Patient Education method: Explanation, Demonstration, and Handouts Education comprehension: verbalized understanding and returned demonstration  HOME EXERCISE PROGRAM: Access Code: [email or web address] URL: [email or web address] Date: 07/13/2023 Prepared by: Ozell Sero  Exercises - Seated Scapular Retraction  - 2 x daily - 5 x weekly - 1 sets - 10 reps - 5 seconds hold - Standing Isometric Shoulder Flexion with Doorway - Arm Bent  - 2 x daily - 5 x weekly - 1 sets - 10 reps - 5 seconds  hold - Standing Isometric Shoulder External Rotation with Doorway  - 2 x daily - 5 x weekly - 1 sets - 10 reps - 5 seconds hold - Standing Isometric Shoulder Abduction with Doorway - Arm Bent  - 2 x daily - 5 x weekly - 1 sets - 10 reps - 5 seconds hold  Access Code: [email or web address] URL: [email or web address] Date: 07/18/2023 Prepared by: Ozell Sero  Exercises - Seated Scapular Retraction  - 2 x daily - 5 x weekly - 1 sets - 10 reps - 5 seconds hold - Standing Isometric Shoulder Flexion with Doorway - Arm Bent  - 2 x daily - 5 x weekly - 1 sets - 10 reps - 5 seconds hold - Standing Isometric Shoulder External Rotation with Doorway  - 2 x daily - 5 x weekly - 1 sets - 10 reps - 5 seconds hold - Standing Isometric Shoulder Abduction with Doorway - Arm Bent  - 2 x  daily - 5 x weekly - 1 sets - 10 reps - 5 seconds hold - Seated Shoulder Flexion AAROM with Pulley Behind  - 1 x daily - 7 x weekly - 3 sets - 10 reps - Seated Shoulder Abduction AAROM with Pulley Behind  - 1 x daily - 7 x weekly - 3 sets - 10 reps  ASSESSMENT:  CLINICAL IMPRESSION: Pt is planning a reverse shoulder replacement in August.  Pt reports increased pain in L shoulder overall, as it now hurts at rest. Pt continues to have significant difficulty with shoulder flexion to 90 deg, and is limited by pain in all movements. Pt reports no pain with IR, and significant pain with ER. Significant tightness noted with STM to L upper traps.  Pt will benefit from continued PT services to improve ROM and strength to maximize recovery outcomes following surgery.   OBJECTIVE IMPAIRMENTS: decreased ROM, decreased strength, impaired sensation, and impaired UE functional use.   ACTIVITY LIMITATIONS: carrying, lifting, bathing, toileting, dressing, reach over head, and hygiene/grooming  PARTICIPATION LIMITATIONS: driving and occupation  PERSONAL FACTORS: Age, Profession, and Time since onset of injury/illness/exacerbation are also  affecting patient's functional outcome.   REHAB POTENTIAL: Good  CLINICAL DECISION MAKING: Evolving/moderate complexity  EVALUATION COMPLEXITY: Moderate   GOALS: Goals reviewed with patient? No  SHORT TERM GOALS: Target date: 08/02/23  Pt will demonstrate independence with HEP to increase L shoulder strength 1/2 muscle grade to improve strength.   Baseline: initial visit. 7/3: see above  Goal status: not met  2.  Pt will report <4/10 L shoulder pain at worst with everyday tasks/ work-related tasks.   Baseline: 8/10 L shoulder pain at worst. 7/3: 4/10 pain today  Goal status: in progress   LONG TERM GOALS: Target date: 09/06/23  Pt. Able to lift/carry 10# box with no L shoulder limitations to improve household tasks/ carrying groceries.    Baseline: L shoulder pain Goal status: INITIAL  2.  Pt will decrease Quick-DASH score to <= 40% to demonstrate improved functional capacity in L shoulder.  Baseline: 56.8% Goal status: INITIAL  3.  Pt will improve gross L shoulder strength to >= 4-/5 to improve post-op prognosis.  Baseline: see MMT  Goal status: INITIAL   PLAN:  PT FREQUENCY: 1-2x/week  PT DURATION: 8 weeks  PLANNED INTERVENTIONS: 97750- Physical Performance Testing, 97110-Therapeutic exercises, 97530- Therapeutic activity, [id number]- Neuromuscular re-education, 97535- Self Care, 02859- Manual therapy, G0283- Electrical stimulation (unattended), Patient/Family education, Joint mobilization, Cryotherapy, and Moist heat  PLAN FOR NEXT SESSION: isometric exercises in seated, washcloth on wall, STM to L upper traps, sidelying or seated L scapular mobs, AAROM with rhythmic stabilizations    Ozell JAYSON Sero, PT, DPT # [id number] Cheron Last, SPT 08/11/2023, 3:48 PM

## 2023-08-15 ENCOUNTER — Ambulatory Visit

## 2023-08-15 DIAGNOSIS — M6281 Muscle weakness (generalized): Secondary | ICD-10-CM

## 2023-08-15 DIAGNOSIS — M25612 Stiffness of left shoulder, not elsewhere classified: Secondary | ICD-10-CM

## 2023-08-15 DIAGNOSIS — G8929 Other chronic pain: Secondary | ICD-10-CM

## 2023-08-15 DIAGNOSIS — M25512 Pain in left shoulder: Secondary | ICD-10-CM | POA: Diagnosis not present

## 2023-08-15 NOTE — Therapy (Signed)
 OUTPATIENT PHYSICAL THERAPY SHOULDER TREATMENT  Patient Name: Belinda Frazier MRN: [id number] DOB:1955-06-26, 68 y.o., female Today's Date: 08/15/2023  END OF SESSION:  PT End of Session - 08/15/23 0906     Visit Number 7    Number of Visits 16    Date for PT Re-Evaluation 09/06/23    PT Start Time 0906    PT Stop Time 0942    PT Time Calculation (min) 36 min          Past Medical History:  Diagnosis Date   Anemia    Arthritis    Breast cancer (HCC) left breast   bilateral mastectomy   Chronic kidney disease    stage III   Diabetes mellitus without complication [facility])    Past Surgical History:  Procedure Laterality Date   CESAREAN SECTION  1990   x2 [id number],8006   CHOLECYSTECTOMY     2019   MASTECTOMY, RADICAL Bilateral 1996   TOTAL KNEE ARTHROPLASTY Left 12/21/2022   Procedure: TOTAL KNEE ARTHROPLASTY;  Surgeon: Ernie Cough, MD;  Location: [facility];  Service: Orthopedics;  Laterality: Left;   Patient Active Problem List   Diagnosis Date Noted   S/P total knee arthroplasty, 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    PCP: Sherial Bail, MD  REFERRING PROVIDER: Kay Kemps, MD  REFERRING DIAG: [phone] (ICD-10-CM) - Complete rotator cuff tear or rupture of left shoulder, not specified as traumatic   THERAPY DIAG:  Chronic left shoulder pain  Shoulder joint stiffness, left  Muscle weakness (generalized)  Rationale for Evaluation and Treatment: Rehabilitation  ONSET DATE: 8 MONTHS AGO   SUBJECTIVE:                                                                                                                                                                                      SUBJECTIVE STATEMENT: Pt tripped over dog in March and landed on L shoulder, had already been having shoulder pain prior to this incident. Imaging showed large L supraspinatus tear. Pt reports difficulties with ADLs and job responsibilities d/t shoulder injury. Pt reports occasional pins and needles  in  her hand, specifically 4th and 5th digits on L. Hx of carpal tunnel and elbow pain B.  Hand dominance: Right  PERTINENT HISTORY: Belinda Frazier is a 68 year old female who presents for pre-operative evaluation for left shoulder surgery.  She is scheduled for a reverse shoulder replacement on August 18th due to a large tear in the left shoulder rotator cuff, with two tendons completely gone and the head of the humerus displaced. She experiences significant pain and difficulty with daily activities, such as putting away dishes. No history of falls, car  accidents, or other injuries that could have caused the shoulder damage. She is also right-handed surprisingly. She is following up with [facility].  Her diabetes is well-controlled with an A1c of 5.8% as of June 25th. She is currently taking Mounjaro for diabetes management. She is also on metformin , glipizide . No history of heart issues, including chest pain or heart flutters, and no recent EKGs except for surgical clearance.  She has a history of left knee replacement surgery and is pleased with the healing of her knee surgery scar. She used meloxicam  post-knee surgery for swelling but has since stopped it. She is currently experiencing leg cramping and has been taking methocarbamol , which she finds helpful. She also takes gabapentin  regularly at bedtime.   PAIN:  Are you having pain? Yes: NPRS scale: 4/10, increases to 8/10 with movement Pain location: L shoulder Pain description: achy dull constantly, sharp pain with movmenet  Aggravating factors: shoulder movement in all planes, lifting >3#, sleeping on L side, crossing arms  Relieving factors: Tylenol  not helpful  PRECAUTIONS: Fall  RED FLAGS: None   WEIGHT BEARING RESTRICTIONS: No  FALLS:  Has patient fallen in last 6 months? Yes. Number of falls 2, tripped over dog and fell on L shoulder, stumbled on unlevel surface in yard.  LIVING ENVIRONMENT: Lives with: lives with their family  and lives with their spouse Lives in: House/apartment Stairs: Yes: External: 7 steps; bilateral but cannot reach both Has following equipment at home: Single point cane, Walker - 2 wheeled, and Ramped entry  OCCUPATION: Case Production designer, theatre/television/film for worker's comp   PLOF: Independent  PATIENT GOALS: return to full, pain free ROM of shoulder, regain functional independance  NEXT MD VISIT: September 19, 2023 for surgery   OBJECTIVE:  Note: Objective measures were completed at Evaluation unless otherwise noted.   PATIENT SURVEYS:  Quick Dash 56.8%  COGNITION: Overall cognitive status: Within functional limits for tasks assessed     SENSATION: Not tested  POSTURE: Pt demonstrates good posture overall.  UPPER EXTREMITY ROM:   Active/ Passive ROM Right eval Left eval  Shoulder flexion 185 18 p/ 130p  Shoulder extension    Shoulder abduction 161 25 p/ 55p  Shoulder adduction    Shoulder internal rotation 78 70  Shoulder external rotation 94 64  Elbow flexion    Elbow extension    Wrist flexion    Wrist extension    Wrist ulnar deviation    Wrist radial deviation    Wrist pronation    Wrist supination    (Blank rows = not tested)  Seated R/L shoulder AROM: flexion (181 deg./41 deg.), abduction (165 deg./52 deg.), ER (79 deg./63 deg.), IR (58 deg./56 deg.).  UPPER EXTREMITY MMT:  MMT Right eval Left eval  Shoulder flexion 4- 3-  Shoulder extension    Shoulder abduction 4 3-  Shoulder adduction    Shoulder internal rotation 4+ 3-  Shoulder external rotation 4+ 3-  Middle trapezius    Lower trapezius    Elbow flexion 4+ 4  Elbow extension 4+ 4- p  Wrist flexion    Wrist extension    Wrist ulnar deviation    Wrist radial deviation    Wrist pronation    Wrist supination    Grip strength (lbs) 45# 35.5#  (Blank rows = not tested)   SHOULDER SPECIAL TESTS: NT  JOINT MOBILITY TESTING:  Good, pain-free joint capsule mobility B  PALPATION:  L anterior deltoid, L AC  joint, tender to palpation  TREATMENT DATE: 08/15/2023  Subjective:  Pt. Reports no change in her L shoulder. Pt reports that she wakes up in pain. Reports 3/10 NPS currently.   There.ex: 10x AAROM overhead flexion with dowel in seated. Mod A from PT to L elbow for elevation. Pt unable to achieve full ROM, limited by pain   Rhythmic stabilizations attempted, d/c due to pt inability to attain/maintain position of arm without increased pain   Supine L shoulder isometrics: ER/IR/abduction/ flexion 5 sec. Holds x 10 reps.  Manual feedback.   3x10 bicep curls w/ 5# DB   Manual tx.: Supine L shoulder inferior/AP/PA grade II mobs for pain control. 2x20 sec    STM to L upper traps. Significant tightness noted, 5 min   Not today:  sidelying scap mobs,   PATIENT EDUCATION: Education details: HEP  Person educated: Patient Education method: Explanation, Demonstration, and Handouts Education comprehension: verbalized understanding and returned demonstration  HOME EXERCISE PROGRAM: Access Code: [email or web address] URL: [email or web address] Date: 07/13/2023 Prepared by: Ozell Sero  Exercises - Seated Scapular Retraction  - 2 x daily - 5 x weekly - 1 sets - 10 reps - 5 seconds hold - Standing Isometric Shoulder Flexion with Doorway - Arm Bent  - 2 x daily - 5 x weekly - 1 sets - 10 reps - 5 seconds hold - Standing Isometric Shoulder External Rotation with Doorway  - 2 x daily - 5 x weekly - 1 sets - 10 reps - 5 seconds hold - Standing Isometric Shoulder Abduction with Doorway - Arm Bent  - 2 x daily - 5 x weekly - 1 sets - 10 reps - 5 seconds hold  Access Code: [email or web address] URL: [email or web address] Date: 07/18/2023 Prepared by: Ozell Sero  Exercises - Seated Scapular Retraction  - 2 x daily - 5 x weekly - 1 sets - 10 reps - 5 seconds hold -  Standing Isometric Shoulder Flexion with Doorway - Arm Bent  - 2 x daily - 5 x weekly - 1 sets - 10 reps - 5 seconds hold - Standing Isometric Shoulder External Rotation with Doorway  - 2 x daily - 5 x weekly - 1 sets - 10 reps - 5 seconds hold - Standing Isometric Shoulder Abduction with Doorway - Arm Bent  - 2 x daily - 5 x weekly - 1 sets - 10 reps - 5 seconds hold - Seated Shoulder Flexion AAROM with Pulley Behind  - 1 x daily - 7 x weekly - 3 sets - 10 reps - Seated Shoulder Abduction AAROM with Pulley Behind  - 1 x daily - 7 x weekly - 3 sets - 10 reps  ASSESSMENT:  CLINICAL IMPRESSION: Pt is planning a reverse shoulder replacement in August.  Pt reports increased pain in L shoulder overall, as it now hurts at rest. Pt continues to have significant difficulty with shoulder flexion to 90 deg, and is limited by pain in all movements. Significant tightness noted with STM to L upper traps.  Pt will benefit from continued PT services to improve ROM and strength to maximize recovery outcomes following surgery.   OBJECTIVE IMPAIRMENTS: decreased ROM, decreased strength, impaired sensation, and impaired UE functional use.   ACTIVITY LIMITATIONS: carrying, lifting, bathing, toileting, dressing, reach over head, and hygiene/grooming  PARTICIPATION LIMITATIONS: driving and occupation  PERSONAL FACTORS: Age, Profession, and Time since onset of injury/illness/exacerbation are also affecting patient's functional outcome.   REHAB POTENTIAL: Good  CLINICAL DECISION MAKING: Evolving/moderate complexity  EVALUATION COMPLEXITY: Moderate   GOALS:  Goals reviewed with patient? No  SHORT TERM GOALS: Target date: 08/02/23  Pt will demonstrate independence with HEP to increase L shoulder strength 1/2 muscle grade to improve strength.   Baseline: initial visit. 7/3: see above  Goal status: not met  2.  Pt will report <4/10 L shoulder pain at worst with everyday tasks/ work-related tasks.   Baseline:  8/10 L shoulder pain at worst. 7/3: 4/10 pain today  Goal status: in progress   LONG TERM GOALS: Target date: 09/06/23  Pt. Able to lift/carry 10# box with no L shoulder limitations to improve household tasks/ carrying groceries.    Baseline: L shoulder pain Goal status: INITIAL  2.  Pt will decrease Quick-DASH score to <= 40% to demonstrate improved functional capacity in L shoulder.  Baseline: 56.8% Goal status: INITIAL  3.  Pt will improve gross L shoulder strength to >= 4-/5 to improve post-op prognosis.  Baseline: see MMT  Goal status: INITIAL   PLAN:  PT FREQUENCY: 1-2x/week  PT DURATION: 8 weeks  PLANNED INTERVENTIONS: 97750- Physical Performance Testing, 97110-Therapeutic exercises, 97530- Therapeutic activity, 97112- Neuromuscular re-education, 97535- Self Care, 02859- Manual therapy, G0283- Electrical stimulation (unattended), Patient/Family education, Joint mobilization, Cryotherapy, and Moist heat  PLAN FOR NEXT SESSION: isometric exercises in seated, washcloth on wall, STM to L upper traps, sidelying or seated L scapular mobs, AAROM with rhythmic stabilizations    Belinda Frazier, SPT  Maryanne Finder, PT, DPT Physical Therapist - [place]  [facility] 08/15/2023, 9:42 AM

## 2023-08-17 ENCOUNTER — Ambulatory Visit: Admitting: Physical Therapy

## 2023-08-22 ENCOUNTER — Ambulatory Visit

## 2023-08-22 DIAGNOSIS — G8929 Other chronic pain: Secondary | ICD-10-CM

## 2023-08-22 DIAGNOSIS — M25512 Pain in left shoulder: Secondary | ICD-10-CM | POA: Diagnosis not present

## 2023-08-22 DIAGNOSIS — M25612 Stiffness of left shoulder, not elsewhere classified: Secondary | ICD-10-CM

## 2023-08-22 DIAGNOSIS — M6281 Muscle weakness (generalized): Secondary | ICD-10-CM

## 2023-08-22 NOTE — Therapy (Signed)
 OUTPATIENT PHYSICAL THERAPY SHOULDER TREATMENT  Patient Name: Belinda Frazier MRN: [id number] DOB:02/20/1955, 68 y.o., female Today's Date: 08/22/2023  END OF SESSION:  PT End of Session - 08/22/23 0942     Visit Number 8    Number of Visits 16    Date for PT Re-Evaluation 09/06/23    PT Start Time 0942    PT Stop Time 1032    PT Time Calculation (min) 50 min          Past Medical History:  Diagnosis Date   Anemia    Arthritis    Breast cancer (HCC) left breast   bilateral mastectomy   Chronic kidney disease    stage III   Diabetes mellitus without complication [facility])    Past Surgical History:  Procedure Laterality Date   CESAREAN SECTION  1990   x2 8009,8006   CHOLECYSTECTOMY     2019   MASTECTOMY, RADICAL Bilateral 1996   TOTAL KNEE ARTHROPLASTY Left 12/21/2022   Procedure: TOTAL KNEE ARTHROPLASTY;  Surgeon: Ernie Cough, MD;  Location: [facility];  Service: Orthopedics;  Laterality: Left;   Patient Active Problem List   Diagnosis Date Noted   S/P total knee arthroplasty, 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  Rationale for Evaluation and Treatment: Rehabilitation  ONSET DATE: 8 MONTHS AGO   SUBJECTIVE:                                                                                                                                                                                      SUBJECTIVE STATEMENT: Pt tripped over dog in March and landed on L shoulder, had already been having shoulder pain prior to this incident. Imaging showed large L supraspinatus tear. Pt reports difficulties with ADLs and job responsibilities d/t shoulder injury. Pt reports occasional pins and needles  in her hand, specifically 4th and 5th digits on L. Hx of carpal tunnel and  elbow pain B.  Hand dominance: Right  PERTINENT HISTORY: Belinda Frazier is a 68 year old female who presents for pre-operative evaluation for left shoulder surgery.  She is scheduled for a reverse shoulder replacement on August 18th due to a large tear in the left shoulder rotator cuff, with two tendons completely gone and the head of the humerus displaced. She experiences significant pain and difficulty with daily activities, such as putting away dishes. No history of falls, car accidents, or other injuries that could have caused the shoulder  damage. She is also right-handed surprisingly. She is following up with [facility].  Her diabetes is well-controlled with an A1c of 5.8% as of June 25th. She is currently taking Mounjaro for diabetes management. She is also on metformin , glipizide . No history of heart issues, including chest pain or heart flutters, and no recent EKGs except for surgical clearance.  She has a history of left knee replacement surgery and is pleased with the healing of her knee surgery scar. She used meloxicam  post-knee surgery for swelling but has since stopped it. She is currently experiencing leg cramping and has been taking methocarbamol , which she finds helpful. She also takes gabapentin  regularly at bedtime.   PAIN:  Are you having pain? Yes: NPRS scale: 4/10, increases to 8/10 with movement Pain location: L shoulder Pain description: achy dull constantly, sharp pain with movmenet  Aggravating factors: shoulder movement in all planes, lifting >3#, sleeping on L side, crossing arms  Relieving factors: Tylenol  not helpful  PRECAUTIONS: Fall  RED FLAGS: None   WEIGHT BEARING RESTRICTIONS: No  FALLS:  Has patient fallen in last 6 months? Yes. Number of falls 2, tripped over dog and fell on L shoulder, stumbled on unlevel surface in yard.  LIVING ENVIRONMENT: Lives with: lives with their family and lives with their spouse Lives in: House/apartment Stairs: Yes:  External: 7 steps; bilateral but cannot reach both Has following equipment at home: Single point cane, Walker - 2 wheeled, and Ramped entry  OCCUPATION: Case Production designer, theatre/television/film for worker's comp   PLOF: Independent  PATIENT GOALS: return to full, pain free ROM of shoulder, regain functional independance  NEXT MD VISIT: September 19, 2023 for surgery   OBJECTIVE:  Note: Objective measures were completed at Evaluation unless otherwise noted.   PATIENT SURVEYS:  Quick Dash 56.8%  COGNITION: Overall cognitive status: Within functional limits for tasks assessed     SENSATION: Not tested  POSTURE: Pt demonstrates good posture overall.  UPPER EXTREMITY ROM:   Active/ Passive ROM Right eval Left eval  Shoulder flexion 185 18 p/ 130p  Shoulder extension    Shoulder abduction 161 25 p/ 55p  Shoulder adduction    Shoulder internal rotation 78 70  Shoulder external rotation 94 64  Elbow flexion    Elbow extension    Wrist flexion    Wrist extension    Wrist ulnar deviation    Wrist radial deviation    Wrist pronation    Wrist supination    (Blank rows = not tested)  Seated R/L shoulder AROM: flexion (181 deg./41 deg.), abduction (165 deg./52 deg.), ER (79 deg./63 deg.), IR (58 deg./56 deg.).  UPPER EXTREMITY MMT:  MMT Right eval Left eval  Shoulder flexion 4- 3-  Shoulder extension    Shoulder abduction 4 3-  Shoulder adduction    Shoulder internal rotation 4+ 3-  Shoulder external rotation 4+ 3-  Middle trapezius    Lower trapezius    Elbow flexion 4+ 4  Elbow extension 4+ 4- p  Wrist flexion    Wrist extension    Wrist ulnar deviation    Wrist radial deviation    Wrist pronation    Wrist supination    Grip strength (lbs) 45# 35.5#  (Blank rows = not tested)   SHOULDER SPECIAL TESTS: NT  JOINT MOBILITY TESTING:  Good, pain-free joint capsule mobility B  PALPATION:  L anterior deltoid, L AC joint, tender to palpation  TREATMENT DATE: 08/22/2023  Subjective:  Pt. Reports her shoulder has been pretty bad these last few days, 6/10 NPS currently. Pt reports feeling more pressure/ tension in her proximal shoudler and into her pectoralis. Pt reports she was unable to exercise last week.   There.ex: 4 min UBE f/b, dicussed symptoms   3x UE ladder, mod A from PT at elbow   10x AAROM overhead flexion with dowel in seated. Mod A from PT to L elbow for elevation. Pt unable to achieve full ROM, limited by pain   Rhythmic stabilizations in seated, 2x30 sec each with shoulder in 20-30 deg. Flexion/ abduction  3x30 sec circles on the wall with washcloth, mod A from PT at elbow   Supine L shoulder isometrics: ER/abduction/ flexion 5 sec. Holds x 10 reps.  Manual feedback.   Manual tx.: Supine L shoulder inferior/AP/PA grade II mobs for pain control. 2x20 sec    STM to L upper traps. Significant tightness noted, 7 min   Not today:  sidelying scap mobs,   PATIENT EDUCATION: Education details: HEP  Person educated: Patient Education method: Explanation, Demonstration, and Handouts Education comprehension: verbalized understanding and returned demonstration  HOME EXERCISE PROGRAM: Access Code: [email or web address] URL: [email or web address] Date: 07/13/2023 Prepared by: Ozell Sero  Exercises - Seated Scapular Retraction  - 2 x daily - 5 x weekly - 1 sets - 10 reps - 5 seconds hold - Standing Isometric Shoulder Flexion with Doorway - Arm Bent  - 2 x daily - 5 x weekly - 1 sets - 10 reps - 5 seconds hold - Standing Isometric Shoulder External Rotation with Doorway  - 2 x daily - 5 x weekly - 1 sets - 10 reps - 5 seconds hold - Standing Isometric Shoulder Abduction with Doorway - Arm Bent  - 2 x daily - 5 x weekly - 1 sets - 10 reps - 5 seconds hold  Access Code: [email or web address] URL: [email or web address] Date:  07/18/2023 Prepared by: Ozell Sero  Exercises - Seated Scapular Retraction  - 2 x daily - 5 x weekly - 1 sets - 10 reps - 5 seconds hold - Standing Isometric Shoulder Flexion with Doorway - Arm Bent  - 2 x daily - 5 x weekly - 1 sets - 10 reps - 5 seconds hold - Standing Isometric Shoulder External Rotation with Doorway  - 2 x daily - 5 x weekly - 1 sets - 10 reps - 5 seconds hold - Standing Isometric Shoulder Abduction with Doorway - Arm Bent  - 2 x daily - 5 x weekly - 1 sets - 10 reps - 5 seconds hold - Seated Shoulder Flexion AAROM with Pulley Behind  - 1 x daily - 7 x weekly - 3 sets - 10 reps - Seated Shoulder Abduction AAROM with Pulley Behind  - 1 x daily - 7 x weekly - 3 sets - 10 reps  ASSESSMENT:  CLINICAL IMPRESSION: Pt is planning a reverse shoulder replacement in August.  Pt reports increased pain in L shoulder and L pec muscle. Pt continues to have significant difficulty with shoulder flexion to 90 deg, and is limited by pain in all movements. Significant tightness noted with STM to L upper traps.  Pt will benefit from continued PT services to improve ROM and strength to maximize recovery outcomes following surgery.   OBJECTIVE IMPAIRMENTS: decreased ROM, decreased strength, impaired sensation, and impaired UE functional use.   ACTIVITY LIMITATIONS: carrying, lifting, bathing, toileting, dressing, reach over head, and hygiene/grooming  PARTICIPATION  LIMITATIONS: driving and occupation  PERSONAL FACTORS: Age, Profession, and Time since onset of injury/illness/exacerbation are also affecting patient's functional outcome.   REHAB POTENTIAL: Good  CLINICAL DECISION MAKING: Evolving/moderate complexity  EVALUATION COMPLEXITY: Moderate   GOALS: Goals reviewed with patient? No  SHORT TERM GOALS: Target date: 08/02/23  Pt will demonstrate independence with HEP to increase L shoulder strength 1/2 muscle grade to improve strength.   Baseline: initial visit. 7/3: see above   Goal status: not met  2.  Pt will report <4/10 L shoulder pain at worst with everyday tasks/ work-related tasks.   Baseline: 8/10 L shoulder pain at worst. 7/3: 4/10 pain today  Goal status: in progress   LONG TERM GOALS: Target date: 09/06/23  Pt. Able to lift/carry 10# box with no L shoulder limitations to improve household tasks/ carrying groceries.    Baseline: L shoulder pain Goal status: INITIAL  2.  Pt will decrease Quick-DASH score to <= 40% to demonstrate improved functional capacity in L shoulder.  Baseline: 56.8% Goal status: INITIAL  3.  Pt will improve gross L shoulder strength to >= 4-/5 to improve post-op prognosis.  Baseline: see MMT  Goal status: INITIAL   PLAN:  PT FREQUENCY: 1-2x/week  PT DURATION: 8 weeks  PLANNED INTERVENTIONS: 97750- Physical Performance Testing, 97110-Therapeutic exercises, 97530- Therapeutic activity, 97112- Neuromuscular re-education, 97535- Self Care, 02859- Manual therapy, G0283- Electrical stimulation (unattended), Patient/Family education, Joint mobilization, Cryotherapy, and Moist heat  PLAN FOR NEXT SESSION: isometric exercises in seated, washcloth on wall, STM to L upper traps, sidelying or seated L scapular mobs, AAROM with rhythmic stabilizations    Sergi Gellner, SPT  Maryanne Finder, PT, DPT Physical Therapist - [place]  [facility] 08/22/2023, 10:34 AM

## 2023-08-30 ENCOUNTER — Ambulatory Visit: Admitting: Physical Therapy

## 2023-09-05 ENCOUNTER — Encounter: Payer: Self-pay | Admitting: Physical Therapy

## 2023-09-05 ENCOUNTER — Ambulatory Visit: Attending: Orthopedic Surgery | Admitting: Physical Therapy

## 2023-09-05 DIAGNOSIS — M25612 Stiffness of left shoulder, not elsewhere classified: Secondary | ICD-10-CM | POA: Diagnosis present

## 2023-09-05 DIAGNOSIS — M6281 Muscle weakness (generalized): Secondary | ICD-10-CM | POA: Diagnosis present

## 2023-09-05 DIAGNOSIS — G8929 Other chronic pain: Secondary | ICD-10-CM | POA: Diagnosis present

## 2023-09-05 DIAGNOSIS — M25512 Pain in left shoulder: Secondary | ICD-10-CM | POA: Diagnosis present

## 2023-09-05 NOTE — Therapy (Signed)
 OUTPATIENT PHYSICAL THERAPY SHOULDER TREATMENT  Patient Name: Zyia Kaneko MRN: [id number] DOB:09-22-1955, 68 y.o., female Today's Date: 09/05/2023  END OF SESSION:  PT End of Session - 09/05/23 0903     Visit Number 9    Number of Visits 16    Date for PT Re-Evaluation 09/06/23    PT Start Time 0903    PT Stop Time 0947    PT Time Calculation (min) 44 min          Past Medical History:  Diagnosis Date   Anemia    Arthritis    Breast cancer (HCC) left breast   bilateral mastectomy   Chronic kidney disease    stage III   Diabetes mellitus without complication [facility])    Past Surgical History:  Procedure Laterality Date   CESAREAN SECTION  1990   x2 8009,8006   CHOLECYSTECTOMY     2019   MASTECTOMY, RADICAL Bilateral 1996   TOTAL KNEE ARTHROPLASTY Left 12/21/2022   Procedure: TOTAL KNEE ARTHROPLASTY;  Surgeon: Ernie Cough, MD;  Location: [facility];  Service: Orthopedics;  Laterality: Left;   Patient Active Problem List   Diagnosis Date Noted   S/P total knee arthroplasty, 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    PCP: Sherial Bail, MD  REFERRING PROVIDER: Kay Kemps, MD  REFERRING DIAG: [phone] (ICD-10-CM) - Complete rotator cuff tear or rupture of left shoulder, not specified as traumatic   THERAPY DIAG:  Chronic left shoulder pain  Shoulder joint stiffness, left  Muscle weakness (generalized)  Rationale for Evaluation and Treatment: Rehabilitation  ONSET DATE: 8 MONTHS AGO   SUBJECTIVE:                                                                                                                                                                                      SUBJECTIVE STATEMENT: Pt tripped over dog in March and landed on L shoulder, had already been having shoulder pain prior to this incident. Imaging showed large L supraspinatus tear. Pt reports difficulties with ADLs and job responsibilities d/t shoulder injury. Pt reports occasional pins and needles  in  her hand, specifically 4th and 5th digits on L. Hx of carpal tunnel and elbow pain B.  Hand dominance: Right  PERTINENT HISTORY: Ladaija Dimino is a 68 year old female who presents for pre-operative evaluation for left shoulder surgery.  She is scheduled for a reverse shoulder replacement on August 18th due to a large tear in the left shoulder rotator cuff, with two tendons completely gone and the head of the humerus displaced. She experiences significant pain and difficulty with daily activities, such as putting away dishes. No history of falls, car  accidents, or other injuries that could have caused the shoulder damage. She is also right-handed surprisingly. She is following up with [facility].  Her diabetes is well-controlled with an A1c of 5.8% as of June 25th. She is currently taking Mounjaro for diabetes management. She is also on metformin , glipizide . No history of heart issues, including chest pain or heart flutters, and no recent EKGs except for surgical clearance.  She has a history of left knee replacement surgery and is pleased with the healing of her knee surgery scar. She used meloxicam  post-knee surgery for swelling but has since stopped it. She is currently experiencing leg cramping and has been taking methocarbamol , which she finds helpful. She also takes gabapentin  regularly at bedtime.   PAIN:  Are you having pain? Yes: NPRS scale: 4/10, increases to 8/10 with movement Pain location: L shoulder Pain description: achy dull constantly, sharp pain with movmenet  Aggravating factors: shoulder movement in all planes, lifting >3#, sleeping on L side, crossing arms  Relieving factors: Tylenol  not helpful  PRECAUTIONS: Fall  RED FLAGS: None   WEIGHT BEARING RESTRICTIONS: No  FALLS:  Has patient fallen in last 6 months? Yes. Number of falls 2, tripped over dog and fell on L shoulder, stumbled on unlevel surface in yard.  LIVING ENVIRONMENT: Lives with: lives with their family  and lives with their spouse Lives in: House/apartment Stairs: Yes: External: 7 steps; bilateral but cannot reach both Has following equipment at home: Single point cane, Walker - 2 wheeled, and Ramped entry  OCCUPATION: Case Production designer, theatre/television/film for worker's comp   PLOF: Independent  PATIENT GOALS: return to full, pain free ROM of shoulder, regain functional independance  NEXT MD VISIT: September 19, 2023 for surgery   OBJECTIVE:  Note: Objective measures were completed at Evaluation unless otherwise noted.   PATIENT SURVEYS:  Quick Dash 56.8%  COGNITION: Overall cognitive status: Within functional limits for tasks assessed     SENSATION: Not tested  POSTURE: Pt demonstrates good posture overall.  UPPER EXTREMITY ROM:   Active/ Passive ROM Right eval Left eval  Shoulder flexion 185 18 p/ 130p  Shoulder extension    Shoulder abduction 161 25 p/ 55p  Shoulder adduction    Shoulder internal rotation 78 70  Shoulder external rotation 94 64  Elbow flexion    Elbow extension    Wrist flexion    Wrist extension    Wrist ulnar deviation    Wrist radial deviation    Wrist pronation    Wrist supination    (Blank rows = not tested)  Seated R/L shoulder AROM: flexion (181 deg./41 deg.), abduction (165 deg./52 deg.), ER (79 deg./63 deg.), IR (58 deg./56 deg.).  UPPER EXTREMITY MMT:  MMT Right eval Left eval  Shoulder flexion 4- 3-  Shoulder extension    Shoulder abduction 4 3-  Shoulder adduction    Shoulder internal rotation 4+ 3-  Shoulder external rotation 4+ 3-  Middle trapezius    Lower trapezius    Elbow flexion 4+ 4  Elbow extension 4+ 4- p  Wrist flexion    Wrist extension    Wrist ulnar deviation    Wrist radial deviation    Wrist pronation    Wrist supination    Grip strength (lbs) 45# 35.5#  (Blank rows = not tested)   SHOULDER SPECIAL TESTS: NT  JOINT MOBILITY TESTING:  Good, pain-free joint capsule mobility B  PALPATION:  L anterior deltoid, L AC  joint, tender to palpation  TREATMENT DATE: 09/05/2023  Subjective:  Pt. Reports good compliance with HEP and L UE use.  Pt. Remains frustrated with L shoulder pain/limitations.  Pt. States insurance is asking for MD peer to peer interview to authorize L reverse total shoulder replacement.  Pt. Reports 4/10 L shoulder pain at rest and 7/10 with movement.  Pt. Reports sleeping remains limited and pt. Has to sleep on R shoulder.    There.ex: Standing L shoulder flexion/ abduction reassessment (pain limited)  Supine L shoulder A/AROM flexion/ abduction/ serratus punches 10x.    Supine L shoulder A/AROM: flexion (42/128 deg.), abduction (50/123 deg.), ER (50/ 55 deg.), IR (70 deg.).  Standing L shoulder A/PROM flexion (44/ 122 deg.), abduction (45/ 95 deg.).  Supine L shoulder rhythmic stabs (all planes)- 4 sets of 20 sec.  Supine L shoulder isometrics: ER/ IR/ abduction/ flexion 5 sec. Holds x 10 reps.  Manual feedback to avoid worsen pain.    Reassessed goals/ discussed upcoming surgery  Not today:  sidelying scap mobs,   PATIENT EDUCATION: Education details: HEP  Person educated: Patient Education method: Explanation, Demonstration, and Handouts Education comprehension: verbalized understanding and returned demonstration  HOME EXERCISE PROGRAM: Access Code: [email or web address] URL: [email or web address] Date: 07/13/2023 Prepared by: Ozell Sero  Exercises - Seated Scapular Retraction  - 2 x daily - 5 x weekly - 1 sets - 10 reps - 5 seconds hold - Standing Isometric Shoulder Flexion with Doorway - Arm Bent  - 2 x daily - 5 x weekly - 1 sets - 10 reps - 5 seconds hold - Standing Isometric Shoulder External Rotation with Doorway  - 2 x daily - 5 x weekly - 1 sets - 10 reps - 5 seconds hold - Standing Isometric Shoulder Abduction with Doorway - Arm Bent  - 2  x daily - 5 x weekly - 1 sets - 10 reps - 5 seconds hold  Access Code: [email or web address] URL: [email or web address] Date: 07/18/2023 Prepared by: Ozell Sero  Exercises - Seated Scapular Retraction  - 2 x daily - 5 x weekly - 1 sets - 10 reps - 5 seconds hold - Standing Isometric Shoulder Flexion with Doorway - Arm Bent  - 2 x daily - 5 x weekly - 1 sets - 10 reps - 5 seconds hold - Standing Isometric Shoulder External Rotation with Doorway  - 2 x daily - 5 x weekly - 1 sets - 10 reps - 5 seconds hold - Standing Isometric Shoulder Abduction with Doorway - Arm Bent  - 2 x daily - 5 x weekly - 1 sets - 10 reps - 5 seconds hold - Seated Shoulder Flexion AAROM with Pulley Behind  - 1 x daily - 7 x weekly - 3 sets - 10 reps - Seated Shoulder Abduction AAROM with Pulley Behind  - 1 x daily - 7 x weekly - 3 sets - 10 reps  ASSESSMENT:  CLINICAL IMPRESSION: Pt is planning a reverse shoulder replacement in August.  Pt reports increased pain in L shoulder and L pec muscle. Pt continues to have significant difficulty with shoulder flexion to 90 deg, and is limited by pain in all movements. Significant tightness noted with STM to L upper traps.  Pt. Will have L reverse TSR in 2 weeks and return to PT once cleared by MD.    OBJECTIVE IMPAIRMENTS: decreased ROM, decreased strength, impaired sensation, and impaired UE functional use.   ACTIVITY LIMITATIONS: carrying, lifting, bathing, toileting, dressing, reach over head, and hygiene/grooming  PARTICIPATION LIMITATIONS: driving  and occupation  PERSONAL FACTORS: Age, Profession, and Time since onset of injury/illness/exacerbation are also affecting patient's functional outcome.   REHAB POTENTIAL: Good  CLINICAL DECISION MAKING: Evolving/moderate complexity  EVALUATION COMPLEXITY: Moderate   GOALS: Goals reviewed with patient? No  SHORT TERM GOALS: Target date: 08/02/23  Pt will demonstrate independence with HEP to increase L shoulder  strength 1/2 muscle grade to improve strength.   Baseline: initial visit. 7/3: see above  Goal status: Not met  2.  Pt will report <4/10 L shoulder pain at worst with everyday tasks/ work-related tasks.   Baseline: 8/10 L shoulder pain at worst. 7/3: 4/10 pain today  Goal status: Not met   LONG TERM GOALS: Target date: 09/06/23  Pt. Able to lift/carry 10# box with no L shoulder limitations to improve household tasks/ carrying groceries.    Baseline: L shoulder pain Goal status: Not met  2.  Pt will decrease Quick-DASH score to <= 40% to demonstrate improved functional capacity in L shoulder.  Baseline: 56.8%.  8/4: 63.6% Goal status: Not met  3.  Pt will improve gross L shoulder strength to >= 4-/5 to improve post-op prognosis.  Baseline: see MMT  Goal status: On-going   PLAN:  PT FREQUENCY: 1-2x/week  PT DURATION: 8 weeks  PLANNED INTERVENTIONS: 97750- Physical Performance Testing, 97110-Therapeutic exercises, 97530- Therapeutic activity, [id number]- Neuromuscular re-education, 97535- Self Care, [id number]- Manual therapy, [id number]- Electrical stimulation (unattended), Patient/Family education, Joint mobilization, Cryotherapy, and Moist heat  PLAN FOR NEXT SESSION: Reevaluate pt. After reverse shoulder replacement.     Ozell JAYSON Sero, PT, DPT # [phone] Physical Therapist - [facility] 09/05/2023, 10:45 AM

## 2023-09-28 ENCOUNTER — Ambulatory Visit: Admitting: Physical Therapy

## 2023-09-30 ENCOUNTER — Encounter: Admitting: Physical Therapy

## 2023-10-05 ENCOUNTER — Encounter: Admitting: Physical Therapy

## 2023-10-07 ENCOUNTER — Encounter: Admitting: Physical Therapy

## 2023-10-11 ENCOUNTER — Ambulatory Visit: Attending: Orthopedic Surgery | Admitting: Physical Therapy

## 2023-10-11 DIAGNOSIS — M25612 Stiffness of left shoulder, not elsewhere classified: Secondary | ICD-10-CM | POA: Diagnosis present

## 2023-10-11 DIAGNOSIS — M6281 Muscle weakness (generalized): Secondary | ICD-10-CM | POA: Diagnosis present

## 2023-10-11 DIAGNOSIS — M25512 Pain in left shoulder: Secondary | ICD-10-CM | POA: Diagnosis present

## 2023-10-11 DIAGNOSIS — G8929 Other chronic pain: Secondary | ICD-10-CM | POA: Diagnosis present

## 2023-10-11 DIAGNOSIS — Z96612 Presence of left artificial shoulder joint: Secondary | ICD-10-CM | POA: Diagnosis present

## 2023-10-11 NOTE — Progress Notes (Unsigned)
 OUTPATIENT PHYSICAL THERAPY SHOULDER EVALUATION  Patient Name: Belinda Frazier MRN: [id number] DOB:1955/05/07, 68 y.o., female Today's Date: 10/12/2023  END OF SESSION:  PT End of Session - 10/11/23 1555     Visit Number 1    Number of Visits 16    Date for PT Re-Evaluation 12/06/23    PT Start Time 1030    PT Stop Time 1115    PT Time Calculation (min) 45 min    Activity Tolerance Patient limited by fatigue;Patient tolerated treatment well    Behavior During Therapy [facility] for tasks assessed/performed          Past Medical History:  Diagnosis Date   Anemia    Arthritis    Breast cancer (HCC) left breast   bilateral mastectomy   Chronic kidney disease    stage III   Diabetes mellitus without complication [facility])    Past Surgical History:  Procedure Laterality Date   CESAREAN SECTION  1990   x2 8009,8006   CHOLECYSTECTOMY     2019   MASTECTOMY, RADICAL Bilateral 1996   TOTAL KNEE ARTHROPLASTY Left 12/21/2022   Procedure: TOTAL KNEE ARTHROPLASTY;  Surgeon: Ernie Cough, MD;  Location: [facility];  Service: Orthopedics;  Laterality: Left;   Patient Active Problem List   Diagnosis Date Noted   S/P total knee arthroplasty, 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    PCP: Sherial Bail, MD  REFERRING PROVIDER: Kay Kemps, MD  REFERRING DIAG: [phone] (ICD-10-CM) - Encounter for other specified surgical aftercare   Rationale for Evaluation and Treatment: Rehabilitation  THERAPY DIAG:  Muscle weakness (generalized)  Shoulder joint stiffness, left  Chronic left shoulder pain  S/P reverse total shoulder arthroplasty, left  ONSET DATE: s/p left reverse total shoulder arthroplasty August 18th, 2025  SUBJECTIVE:                                                                                                                                                                                           SUBJECTIVE STATEMENT: Pt. Is a 68 year old female presenting to physical therapy s/p reverse  total shoulder arthroplasty of left shoulder on (09/19/23). Pt. Experiencing ~1-2/10 twitch/twinging type of pain over the anterior shaft of the humerus. Pt. States that this number can increase to a slightly higher number (3/10 NPS) if she is reaching overhead or internally rotating her arm when changing clothes or washing dishes. Pt. Denies any distal symptoms, numbness/tingling or burning sensations, pain over the humeral head or upper traps region, or any recent falls. Pt. Does report mild discomfort at the site of the incision and states that the inferior aspect of the incision feels hard.  Pt. States that she is no longer taking any pain medication and that she only had to take Hydrocodone immediately after the surgery on 09/09. Pt. Reports that she currently has no post-surgical restrictions other than lifting heavy objects. Pt. States that she was told by MD that she can return to driving (sticking to shorter durations at this time) but she has yet to attempt. Pt. 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   PERTINENT HISTORY:  Pt. Previously seen in physical therapy for left shoulder pain after a trip/fall over her dog in March of 2025.   PAIN:  Are you having pain? Yes: NPRS scale: 2/10 Pain location: Anterior shaft of left humerus  Pain description: Twitching type ache Aggravating factors: Reaching tasks involving shoulder flexion or IR such as reaching for top cabinets, taking off jacket, or combing her hair Relieving factors: Rest, ice on rare occasions  PRECAUTIONS:  None  RED FLAGS: None   WEIGHT BEARING RESTRICTIONS:  No  FALLS:  Has patient fallen in last 6 months? No  LIVING ENVIRONMENT: Lives with: lives with their spouse Lives in: House/apartment Stairs: Yes: External: 7 steps; bilateral but cannot reach both Has following equipment at home: Single point cane, Walker - 2 wheeled, and Ramped  entry  OCCUPATION:  Sports coach for [employer].  PLOF:  Independent  PATIENT GOALS:  To increase strength and ROM of left shoulder so that she can drive, wash dishes, cook, and change her clothes without pain.   NEXT MD VISIT: October 7th, 2025   OBJECTIVE:  Note: Objective measures were completed at Evaluation unless otherwise noted.  PATIENT SURVEYS:  Will assess QuickDash during next session.   COGNITIVE STATUS: Within functional limits for tasks assessed   SENSATION: Light touch: WFL  SKIN INTEGRITY:  Incision site demonstrating proper healing  POSTURE:  rounded shoulders and forward head  PALPATION: Mild tenderness noted over left anterior deltoid into proximal biceps region  HAND DOMINANCE:  Right  UPPER EXTREMITY ROM:    Active ROM Right eval Left eval  Shoulder flexion 185 deg.  122 deg.  Shoulder extension      Shoulder abduction   160 deg. 89 deg.  Shoulder adduction      Shoulder internal rotation   76 deg.  70 deg.  Shoulder external rotation   91 deg. 40 deg.  Elbow flexion      Elbow extension      Wrist flexion      Wrist extension      Wrist ulnar deviation      Wrist radial deviation      Wrist pronation      Wrist supination      (Blank rows = not tested)   UPPER EXTREMITY MMT:   MMT Right eval Left eval  Shoulder flexion 4  Deferred  Shoulder extension      Shoulder abduction  4- Deferred  Shoulder adduction      Shoulder internal rotation 4+ 3 *  Shoulder external rotation  4+  3 *  Middle trapezius      Lower trapezius      Elbow flexion  4+ 4- *  Elbow extension  4+  4  Wrist flexion      Wrist extension      Wrist ulnar deviation      Wrist radial deviation      Wrist pronation 5  5   Wrist  supination  5  5  Grip strength (lbs)    (Blank rows = not tested)                                                                                                                           TREATMENT DATE:  10/11/23 Supine PROM of left shoulder: flexion and abduction limited by pain at end range with firm end feel noted.   See HEP  PATIENT EDUCATION:  Education details: Pt. Educated on diagnosis, prognosis, anatomy involved, and POC. Pt. And husband of pt. Educated on HEP. Person educated: Patient and Spouse Education method: Explanation, Demonstration, Tactile cues, Verbal cues, and Handouts Education comprehension: verbalized understanding and returned demonstration  HOME EXERCISE PROGRAM: Access Code: [email or web address] URL: [email or web address] Date: 10/11/2023 Prepared by: Ozell Sero Exercises - Supine Shoulder Flexion Extension AAROM with Dowel - 1 x daily - 7 x weekly - 2 sets - 10 reps - Seated Shoulder Abduction AAROM with Dowel - 1 x daily - 7 x weekly - 2 sets - 10 reps - Seated Scapular Retraction - 1 x daily - 4-5 x weekly - 2 sets - 12 reps - Standing Isometric Shoulder External Rotation with Doorway - 1 x daily - 4-5 x weekly - 2 sets - 8 reps - Standing Isometric Shoulder Abduction with Doorway - Arm Bent - 1 x daily - 4-5 x weekly - 2 sets - 8 reps    ASSESSMENT:  CLINICAL IMPRESSION: Patient is a pleasant 68 y.o. female who was seen today for physical therapy evaluation and treatment for left shoulder pain s/p reverse total shoulder arthroplasty on 09/19/23. Pt. Incision sites demonstrated proper healing at this time. Pt. presents with deficits in active L shoulder flexion and abduction and strength deficits in her left UE which limits her ability to complete functional tasks such as dressing, cooking, cleaning, and driving which she does for work. Pt. Would benefit from skilled physical therapy intervention 2x a week for 8 weeks in order to address these current limitations.    OBJECTIVE IMPAIRMENTS: decreased ROM, decreased strength, and pain.   ACTIVITY LIMITATIONS: carrying, lifting, and dressing  PARTICIPATION LIMITATIONS: meal prep, cleaning, laundry, driving,  and occupation  PERSONAL FACTORS: 1-2 comorbidities: CKD and DM are also affecting patient's functional outcome.   REHAB POTENTIAL: Good  CLINICAL DECISION MAKING: Evolving/moderate complexity  EVALUATION COMPLEXITY: Moderate   GOALS: Goals reviewed with patient? Yes  SHORT TERM GOALS: Target date: 11/01/23  Pt. To become independent with HEP to increase active left shoulder abduction to at least 100 deg. to improve functional reaching/ ADLs. Baseline: Goal status: INITIAL  2.  Pt. will improve gross left UE strength to at least a 4/5 in order to improve ability to drive her car which she does a lot of for work.  Baseline:  Goal status: INITIAL  LONG TERM GOALS: Target date: 12/06/23  Pt. Will improve QuickDash score by at least 8 points (MCID) in order for pt. To  allow for completion of  daily activities with greater ease. Baseline: test during next session Goal status: INITIAL  2.  Pt. To experience 0/10 pain during functional care activities while at home such as washing dishes, cooking, and cleaning.  Baseline: ~3/10 twitch pain Goal status: INITIAL  3.  Pt. to be able to actively elevate left shoulder to at least 140 degrees to improve ability to reach for items in top cabinets of kitchen and bathroom.  Baseline: 122 deg. of active flexion Goal status: INITIAL  PLAN:  PT FREQUENCY: 2x/week  PT DURATION: 8 weeks  PLANNED INTERVENTIONS: 97164- PT Re-evaluation, 97110-Therapeutic exercises, 97530- Therapeutic activity, [id number]- Neuromuscular re-education, 97535- Self Care, 02859- Manual therapy, Patient/Family education, Joint mobilization, Cryotherapy, and Moist heat.  PLAN FOR NEXT SESSION: Assess passive ROM of left shoulder, cervical ROM, QuickDash, introduce gross shoulder and periscapular strengthening activities to tolerance.   Ozell JAYSON Sero, PT, DPT # [id number] Curtistine Bracket, SPT 10/12/2023, 9:23 AM

## 2023-10-12 ENCOUNTER — Encounter: Payer: Self-pay | Admitting: Physical Therapy

## 2023-10-13 ENCOUNTER — Ambulatory Visit: Admitting: Physical Therapy

## 2023-10-13 DIAGNOSIS — M25612 Stiffness of left shoulder, not elsewhere classified: Secondary | ICD-10-CM

## 2023-10-13 DIAGNOSIS — Z96612 Presence of left artificial shoulder joint: Secondary | ICD-10-CM

## 2023-10-13 DIAGNOSIS — G8929 Other chronic pain: Secondary | ICD-10-CM

## 2023-10-13 DIAGNOSIS — M6281 Muscle weakness (generalized): Secondary | ICD-10-CM | POA: Diagnosis not present

## 2023-10-13 NOTE — Therapy (Unsigned)
 OUTPATIENT PHYSICAL THERAPY SHOULDER TREATMENT   Patient Name: Belinda Frazier MRN: [id number] DOB:01-11-1956, 68 y.o., female Today's Date: 10/14/2023   END OF SESSION:  PT End of Session - 10/13/23 1114     Visit Number 2    Number of Visits 16    Date for PT Re-Evaluation 12/06/23    PT Start Time 1032    PT Stop Time 1116    PT Time Calculation (min) 44 min    Activity Tolerance Patient limited by fatigue;Patient tolerated treatment well;No increased pain    Behavior During Therapy WFL for tasks assessed/performed              Past Medical History:  Diagnosis Date   Anemia     Arthritis     Breast cancer (HCC) left breast    bilateral mastectomy   Chronic kidney disease      stage III   Diabetes mellitus without complication [facility])               Past Surgical History:  Procedure Laterality Date   CESAREAN SECTION   1990    x2 [id number]   CHOLECYSTECTOMY        2019   MASTECTOMY, RADICAL Bilateral 1996   TOTAL KNEE ARTHROPLASTY Left 12/21/2022    Procedure: TOTAL KNEE ARTHROPLASTY;  Surgeon: Ernie Cough, MD;  Location: [facility];  Service: Orthopedics;  Laterality: Left;            Patient Active Problem List    Diagnosis Date Noted   S/P total knee arthroplasty, 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      PCP: Sherial Bail, MD   REFERRING PROVIDER: Kay Kemps, MD   REFERRING DIAG: [id number] (ICD-10-CM) - Encounter for other specified surgical aftercare    Rationale for Evaluation and Treatment: Rehabilitation   THERAPY DIAG:  Muscle weakness (generalized)   Shoulder joint stiffness, left   Chronic left shoulder pain   S/P reverse total shoulder arthroplasty, left   ONSET DATE: s/p left reverse total shoulder arthroplasty August 18th, 2025   SUBJECTIVE:                                                                                                                                                                                            SUBJECTIVE  STATEMENT: Pt. Is a 68 year old female presenting to physical therapy s/p reverse total shoulder arthroplasty of left shoulder on (09/19/23). Pt. Experiencing ~1-2/10 twitch/twinging type of pain over the anterior shaft of the humerus. Pt. States that this number can increase to a slightly higher number (3/10 NPS) if she is reaching overhead or internally rotating  her arm when changing clothes or washing dishes. Pt. Denies any distal symptoms, numbness/tingling or burning sensations, pain over the humeral head or upper traps region, or any recent falls. Pt. Does report mild discomfort at the site of the incision and states that the inferior aspect of the incision feels hard. Pt. States that she is no longer taking any pain medication and that she only had to take Hydrocodone immediately after the surgery on 09/09. Pt. Reports that she currently has no post-surgical restrictions other than lifting heavy objects. Pt. States that she was told by MD that she can return to driving (sticking to shorter durations at this time) but she has yet to attempt. Pt. 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    PERTINENT HISTORY:  Pt. Previously seen in physical therapy for left shoulder pain after a trip/fall over her dog in March of 2025.    PAIN:  Are you having pain? Yes: NPRS scale: 2/10 Pain location: Anterior shaft of left humerus  Pain description: Twitching type ache Aggravating factors: Reaching tasks involving shoulder flexion or IR such as reaching for top cabinets, taking off jacket, or combing her hair Relieving factors: Rest, ice on rare occasions   PRECAUTIONS:  None   RED FLAGS: None       WEIGHT BEARING RESTRICTIONS:  No   FALLS:  Has patient fallen in last 6 months? No   LIVING ENVIRONMENT: Lives with: lives with their spouse Lives in: House/apartment Stairs: Yes: External: 7 steps; bilateral but cannot reach both Has  following equipment at home: Single point cane, Walker - 2 wheeled, and Ramped entry   OCCUPATION:  Sports coach for [employer].   PLOF:  Independent   PATIENT GOALS:  To increase strength and ROM of left shoulder so that she can drive, wash dishes, cook, and change her clothes without pain.    NEXT MD VISIT: October 7th, 2025     OBJECTIVE:  Note: Objective measures were completed at Evaluation unless otherwise noted.   PATIENT SURVEYS:  Will assess QuickDash during next session.    COGNITIVE STATUS: Within functional limits for tasks assessed        SENSATION: Light touch: WFL   SKIN INTEGRITY:             Incision site demonstrating proper healing   POSTURE:  rounded shoulders and forward head   PALPATION: Mild tenderness noted over left anterior deltoid into proximal biceps region   HAND DOMINANCE:  Right   UPPER EXTREMITY ROM:    Active ROM Right eval Left eval  Shoulder flexion 185 deg.  122 deg.  Shoulder extension      Shoulder abduction   160 deg. 89 deg.  Shoulder adduction      Shoulder internal rotation   76 deg.  70 deg.  Shoulder external rotation   91 deg. 40 deg.  Elbow flexion      Elbow extension      Wrist flexion      Wrist extension      Wrist ulnar deviation      Wrist radial deviation      Wrist pronation      Wrist supination      (Blank rows = not tested)   UPPER EXTREMITY MMT:   MMT Right eval Left eval  Shoulder flexion 4  Deferred  Shoulder extension      Shoulder abduction  4-  Deferred  Shoulder adduction      Shoulder internal rotation 4+ 3 *  Shoulder external rotation  4+  3 *  Middle trapezius      Lower trapezius      Elbow flexion  4+ 4- *  Elbow extension  4+  4  Wrist flexion      Wrist extension      Wrist ulnar deviation      Wrist radial deviation      Wrist pronation 5  5   Wrist supination  5  5  Grip strength (lbs)      (Blank rows = not tested)                                                                                                                        TREATMENT DATE: 10/13/2023  Subjective: Pt. Reports a mild 1/10 pain in the anterior aspect of her left shoulder. Pt. Reports adherence to her HEP with no pain during the exercises but pt. did report an episode of intense shoulder pain last night randomly, I wasn't even doing anything which dissipated a few seconds later.   Manual: Supine PROM of left shoulder: flexion, scaption, abduction, ER, IR (no increase in pain)   Therapeutic Ex.: Supine active left shoulder flexion with gentle overpressure applied at end range, 1x8  Supine rhythmic stabilization with shoulder flexed to 90 degrees, 2x30 seconds  Supine Active D2 shoulder flexion pattern, 1x8  Supine Isometric flexion+extension against manual resistance from therapist, 3x10 each  Seated Isometric ER, IR, abduction against manual resistance from therapist, 2x10 each  Standing left shoulder flexion with ladder, 1x8  Cervical AROM Assessment: Flexion, Bilateral flexion, bilateral lateral rotation, extension: WFL        PATIENT EDUCATION:  Education details: Pt. Educated on diagnosis, prognosis, anatomy involved, and POC. Pt. And husband of pt. Educated on HEP. Person educated: Patient and Spouse Education method: Explanation, Demonstration, Tactile cues, Verbal cues, and Handouts Education comprehension: verbalized understanding and returned demonstration   HOME EXERCISE PROGRAM: Access Code: [email or web address] URL: [email or web address] Date: 10/11/2023 Prepared by: Ozell Sero Exercises - Supine Shoulder Flexion Extension AAROM with Dowel - 1 x daily - 7 x weekly - 2 sets - 10 reps - Seated Shoulder Abduction AAROM with Dowel - 1 x daily - 7 x weekly - 2 sets - 10 reps - Seated Scapular Retraction - 1 x daily - 4-5 x weekly - 2 sets - 12 reps - Standing Isometric Shoulder External Rotation with Doorway - 1 x daily - 4-5 x weekly - 2 sets - 8 reps -  Standing Isometric Shoulder Abduction with Doorway - Arm Bent - 1 x daily - 4-5 x weekly - 2 sets - 8 reps      ASSESSMENT:   CLINICAL IMPRESSION: Focus of today's tx. Session was to introduce new isometric strengthening exercises and progress passive, active, and active assisted mobility of left shoulder to tolerance. Pt. Was introduced to supine isometric flexion  and extension and seated ER/IR/abduction  against manual resistance from therapist, standing shoulder ladder into flexion, active D2 shoulder flexion pattern in supine, and supine rhythmic stabilization with shoulder flexed to 90 degrees on this day which pt. Was able to complete without significant difficulty on this day. Pt. Tolerated all new interventions well on this day without an increase in pain at end of tx. Session. Will continue to monitor pt. Symptoms and progress as able.      OBJECTIVE IMPAIRMENTS: decreased ROM, decreased strength, and pain.    ACTIVITY LIMITATIONS: carrying, lifting, and dressing   PARTICIPATION LIMITATIONS: meal prep, cleaning, laundry, driving, and occupation   PERSONAL FACTORS: 1-2 comorbidities: CKD and DM are also affecting patient's functional outcome.    REHAB POTENTIAL: Good   CLINICAL DECISION MAKING: Evolving/moderate complexity   EVALUATION COMPLEXITY: Moderate     GOALS: Goals reviewed with patient? Yes   SHORT TERM GOALS: Target date: 11/01/23   Pt. To become independent with HEP to increase active left shoulder abduction to at least 100 deg. to improve functional reaching/ ADLs. Baseline: Goal status: INITIAL   2.  Pt. will improve gross left UE strength to at least a 4/5 in order to improve ability to drive her car which she does a lot of for work.  Baseline:  Goal status: INITIAL   LONG TERM GOALS: Target date: 12/06/23   Pt. Will improve QuickDash score by at least 8 points (MCID) in order for pt. To allow for completion of  daily activities with greater  ease. Baseline: test during next session Goal status: INITIAL   2.  Pt. To experience 0/10 pain during functional care activities while at home such as washing dishes, cooking, and cleaning.  Baseline: ~3/10 twitch pain Goal status: INITIAL   3.  Pt. to be able to actively elevate left shoulder to at least 140 degrees to improve ability to reach for items in top cabinets of kitchen and bathroom.  Baseline: 122 deg. of active flexion Goal status: INITIAL   PLAN:   PT FREQUENCY: 2x/week   PT DURATION: 8 weeks   PLANNED INTERVENTIONS: 97164- PT Re-evaluation, 97110-Therapeutic exercises, 97530- Therapeutic activity, [id number]- Neuromuscular re-education, 97535- Self Care, [id number]- Manual therapy, Patient/Family education, Joint mobilization, Cryotherapy, and Moist heat.   PLAN FOR NEXT SESSION: Gradual introduction of gross shoulder and periscapular strengthening activities to tolerance. Active shoulder ROM in seated position to tolerance. ANNITTA Ozell JAYSON Ileene, PT, DPT # [id number] Curtistine Bracket, SPT 10/14/2023

## 2023-10-18 ENCOUNTER — Ambulatory Visit: Admitting: Physical Therapy

## 2023-10-18 DIAGNOSIS — M6281 Muscle weakness (generalized): Secondary | ICD-10-CM | POA: Diagnosis not present

## 2023-10-18 DIAGNOSIS — Z96612 Presence of left artificial shoulder joint: Secondary | ICD-10-CM

## 2023-10-18 DIAGNOSIS — M25612 Stiffness of left shoulder, not elsewhere classified: Secondary | ICD-10-CM

## 2023-10-18 DIAGNOSIS — G8929 Other chronic pain: Secondary | ICD-10-CM

## 2023-10-18 NOTE — Therapy (Signed)
 OUTPATIENT PHYSICAL THERAPY SHOULDER TREATMENT   Patient Name: Belinda Frazier MRN: [id number] DOB:[birth year], 68 y.o., female Today's Date: 10/18/2023   END OF SESSION:  PT End of Session - 10/18/23 1021     Visit Number 3    Number of Visits 16    Date for PT Re-Evaluation 12/06/23    PT Start Time 1021    PT Stop Time 1111    PT Time Calculation (min) 50 min    Activity Tolerance Patient limited by fatigue;Patient tolerated treatment well;No increased pain    Behavior During Therapy WFL for tasks assessed/performed              Past Medical History:  Diagnosis Date   Anemia     Arthritis     Breast cancer (HCC) left breast    bilateral mastectomy   Chronic kidney disease      stage III   Diabetes mellitus without complication [facility])               Past Surgical History:  Procedure Laterality Date   CESAREAN SECTION   1990    x2 [id number]   CHOLECYSTECTOMY        2019   MASTECTOMY, RADICAL Bilateral 1996   TOTAL KNEE ARTHROPLASTY Left 12/21/2022    Procedure: TOTAL KNEE ARTHROPLASTY;  Surgeon: Ernie Cough, MD;  Location: [facility];  Service: Orthopedics;  Laterality: Left;            Patient Active Problem List    Diagnosis Date Noted   S/P total knee arthroplasty, 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      PCP: Sherial Bail, MD   REFERRING PROVIDER: Kay Kemps, MD   REFERRING DIAG: [phone] (ICD-10-CM) - Encounter for other specified surgical aftercare    Rationale for Evaluation and Treatment: Rehabilitation   THERAPY DIAG:  Muscle weakness (generalized)   Shoulder joint stiffness, left   Chronic left shoulder pain   S/P reverse total shoulder arthroplasty, left   ONSET DATE: s/p left reverse total shoulder arthroplasty August 18th, 2025   SUBJECTIVE:                                                                                                                                                                                            SUBJECTIVE  STATEMENT: Pt. Is a 68 year old female presenting to physical therapy s/p reverse total shoulder arthroplasty of left shoulder on (09/19/23). Pt. Experiencing ~1-2/10 twitch/twinging type of pain over the anterior shaft of the humerus. Pt. States that this number can increase to a slightly higher number (3/10 NPS) if she is reaching overhead or internally rotating  her arm when changing clothes or washing dishes. Pt. Denies any distal symptoms, numbness/tingling or burning sensations, pain over the humeral head or upper traps region, or any recent falls. Pt. Does report mild discomfort at the site of the incision and states that the inferior aspect of the incision feels hard. Pt. States that she is no longer taking any pain medication and that she only had to take Hydrocodone immediately after the surgery on 09/09. Pt. Reports that she currently has no post-surgical restrictions other than lifting heavy objects. Pt. States that she was told by MD that she can return to driving (sticking to shorter durations at this time) but she has yet to attempt. Pt. 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    PERTINENT HISTORY:  Pt. Previously seen in physical therapy for left shoulder pain after a trip/fall over her dog in March of 2025.    PAIN:  Are you having pain? Yes: NPRS scale: 2/10 Pain location: Anterior shaft of left humerus  Pain description: Twitching type ache Aggravating factors: Reaching tasks involving shoulder flexion or IR such as reaching for top cabinets, taking off jacket, or combing her hair Relieving factors: Rest, ice on rare occasions   PRECAUTIONS:  None   RED FLAGS: None       WEIGHT BEARING RESTRICTIONS:  No   FALLS:  Has patient fallen in last 6 months? No   LIVING ENVIRONMENT: Lives with: lives with their spouse Lives in: House/apartment Stairs: Yes: External: 7 steps; bilateral but cannot reach both Has  following equipment at home: Single point cane, Walker - 2 wheeled, and Ramped entry   OCCUPATION:  Sports coach for [employer].   PLOF:  Independent   PATIENT GOALS:  To increase strength and ROM of left shoulder so that she can drive, wash dishes, cook, and change her clothes without pain.    NEXT MD VISIT: October 7th, 2025     OBJECTIVE:  Note: Objective measures were completed at Evaluation unless otherwise noted.   PATIENT SURVEYS:  Will assess QuickDash during next session.    COGNITIVE STATUS: Within functional limits for tasks assessed        SENSATION: Light touch: WFL   SKIN INTEGRITY:             Incision site demonstrating proper healing   POSTURE:  rounded shoulders and forward head   PALPATION: Mild tenderness noted over left anterior deltoid into proximal biceps region   HAND DOMINANCE:  Right   UPPER EXTREMITY ROM:    Active ROM Right eval Left eval  Shoulder flexion 185 deg.  122 deg.  Shoulder extension      Shoulder abduction   160 deg. 89 deg.  Shoulder adduction      Shoulder internal rotation   76 deg.  70 deg.  Shoulder external rotation   91 deg. 40 deg.  Elbow flexion      Elbow extension      Wrist flexion      Wrist extension      Wrist ulnar deviation      Wrist radial deviation      Wrist pronation      Wrist supination      (Blank rows = not tested)   UPPER EXTREMITY MMT:   MMT Right eval Left eval  Shoulder flexion 4  Deferred  Shoulder extension      Shoulder abduction  4-  Deferred  Shoulder adduction      Shoulder internal rotation 4+ 3 *  Shoulder external rotation  4+  3 *  Middle trapezius      Lower trapezius      Elbow flexion  4+ 4- *  Elbow extension  4+  4  Wrist flexion      Wrist extension      Wrist ulnar deviation      Wrist radial deviation      Wrist pronation 5  5   Wrist supination  5  5  Grip strength (lbs)      (Blank rows = not tested)             Cervical AROM  Assessment: Flexion, Bilateral flexion, bilateral lateral rotation, extension: WFL                                                                                                           TREATMENT DATE: 10/18/2023  Subjective: Pt. Reports a mild ~1/10 pain in the anterior aspect of her left shoulder. Pt reports adherence to her HEP without pain and would like to know what other exercises she can continue to do during her upcoming trip.   Manual: Supine PROM of left shoulder: flexion, scaption, abduction, ER, IR (no increased in pain)   Therapeutic Ex.: Supine active left shoulder flexion with gentle overpressure applied at end range, 1x8  Supine rhythmic stabilization while holding 4# medicine ball with shoulder flexed to 90 degrees, 2x30 seconds  Supine Active D2 shoulder flexion pattern, 1x8  Supine Isometric flexion+extension against manual resistance from therapist, 3x10 each  Seated Isometric ER, IR, abduction against manual resistance from therapist, 2x10 each  Standing left shoulder flexion with ladder, 1x8  Supine bicep curls, tricep skull crushers, and serratus punches, 2x10 each with 3# dumbbell  10/18/23 QuickDash: 18.2%  Updated and issued new HEP  PATIENT EDUCATION:  Education details: Pt. Educated on diagnosis, prognosis, anatomy involved, and POC. Pt. And husband of pt. Educated on HEP. Person educated: Patient and Spouse Education method: Explanation, Demonstration, Tactile cues, Verbal cues, and Handouts Education comprehension: verbalized understanding and returned demonstration   HOME EXERCISE PROGRAM: Access Code: [email or web address] URL: [email or web address] Date: 10/11/2023 Prepared by: Ozell Sero Exercises - Supine Shoulder Flexion Extension AAROM with Dowel - 1 x daily - 7 x weekly - 2 sets - 10 reps - Seated Shoulder Abduction AAROM with Dowel - 1 x daily - 7 x weekly - 2 sets - 10 reps - Seated Scapular Retraction - 1 x daily - 4-5 x weekly - 2  sets - 12 reps - Standing Isometric Shoulder External Rotation with Doorway - 1 x daily - 4-5 x weekly - 2 sets - 8 reps - Standing Isometric Shoulder Abduction with Doorway - Arm Bent - 1 x daily - 4-5 x weekly - 2 sets - 8 reps    Access Code: [email or web address] URL: [email or web address] Date: 10/18/2023 Prepared by: Ozell Sero Exercises - Supine Shoulder Flexion Extension AAROM with Dowel - 1 x daily - 7 x  weekly - 2 sets - 10 reps - Seated Shoulder Abduction AAROM with Dowel - 1 x daily - 7 x weekly - 2 sets - 10 reps - Standing Isometric Shoulder External Rotation with Doorway - 1 x daily - 4-5 x weekly - 2 sets - 8 reps - Standing Isometric Shoulder Abduction with Doorway - Arm Bent - 1 x daily - 4-5 x weekly - 2 sets - 8 reps - Seated Shoulder Row with Anchored Resistance - 1 x daily - 4-5 x weekly - 2 sets - 12 reps - Seated Shoulder Extension and Scapular Retraction with Resistance - 1 x daily - 4-5 x weekly - 2 sets - 12 reps - Supine Bilateral Elbow Flexion Extension with Dumbbells - 1 x daily - 7 x weekly - 3 sets - 8 reps - Supine Scapular Protraction in Flexion with Dumbbells - 1 x daily - 7 x weekly - 2 sets - 10 reps    ASSESSMENT:   CLINICAL IMPRESSION: Pt was introduced to three new exercises on this day which were supine bicep curls, tricep skull crushers, and serratus punches with 3# dumbbell. Pt was issued an updated HEP and verbalized understanding. Pt was also progressed to completing rhythmic stabilization exercise in supine with left shoulder flexed to 90 degrees while holding medicine ball to challenge endurance capacity of left shoulder. Pt tolerated all new exercises and progressions well on this day without significant difficulty or an increase in left shoulder pain by end of tx session. Will continue to monitor pt. Symptoms and progress as able.      OBJECTIVE IMPAIRMENTS: decreased ROM, decreased strength, and pain.    ACTIVITY LIMITATIONS: carrying, lifting, and  dressing   PARTICIPATION LIMITATIONS: meal prep, cleaning, laundry, driving, and occupation   PERSONAL FACTORS: 1-2 comorbidities: CKD and DM are also affecting patient's functional outcome.    REHAB POTENTIAL: Good   CLINICAL DECISION MAKING: Evolving/moderate complexity   EVALUATION COMPLEXITY: Moderate     GOALS: Goals reviewed with patient? Yes   SHORT TERM GOALS: Target date: 11/01/23   Pt. To become independent with HEP to increase active left shoulder abduction to at least 100 deg. to improve functional reaching/ ADLs. Baseline: Goal status: INITIAL   2.  Pt. will improve gross left UE strength to at least a 4/5 in order to improve ability to drive her car which she does a lot of for work.  Baseline:  Goal status: INITIAL   LONG TERM GOALS: Target date: 12/06/23   Pt. Will improve QuickDash score by at least 8 points (MCID) in order for pt. To allow for completion of  daily activities with greater ease. Baseline: 09/16: 18.2% Goal status: On-going   2.  Pt. To experience 0/10 pain during functional care activities while at home such as washing dishes, cooking, and cleaning.  Baseline: ~3/10 twitch pain Goal status: INITIAL   3.  Pt. to be able to actively elevate left shoulder to at least 140 degrees to improve ability to reach for items in top cabinets of kitchen and bathroom.  Baseline: 122 deg. of active flexion Goal status: INITIAL   PLAN:   PT FREQUENCY: 2x/week   PT DURATION: 8 weeks   PLANNED INTERVENTIONS: 97164- PT Re-evaluation, 97110-Therapeutic exercises, 97530- Therapeutic activity, [id number]- Neuromuscular re-education, 97535- Self Care, 02859- Manual therapy, Patient/Family education, Joint mobilization, Cryotherapy, and Moist heat.   PLAN FOR NEXT SESSION: Progress isometric strengthening exercises to isotonic strengthening throughout the full range of motion available.  Ozell JAYSON Sero, PT, DPT # [id number] Curtistine Bracket, SPT 10/18/2023

## 2023-10-20 ENCOUNTER — Encounter: Admitting: Physical Therapy

## 2023-10-21 ENCOUNTER — Ambulatory Visit: Admitting: Physical Therapy

## 2023-10-25 ENCOUNTER — Encounter: Admitting: Physical Therapy

## 2023-10-27 ENCOUNTER — Encounter: Admitting: Physical Therapy

## 2023-11-01 ENCOUNTER — Encounter: Admitting: Physical Therapy

## 2023-11-03 ENCOUNTER — Ambulatory Visit: Admitting: Physical Therapy

## 2023-11-04 ENCOUNTER — Encounter: Admitting: Physical Therapy

## 2023-11-04 ENCOUNTER — Ambulatory Visit: Attending: Orthopedic Surgery | Admitting: Physical Therapy

## 2023-11-04 DIAGNOSIS — M6281 Muscle weakness (generalized): Secondary | ICD-10-CM | POA: Diagnosis present

## 2023-11-04 DIAGNOSIS — M25512 Pain in left shoulder: Secondary | ICD-10-CM | POA: Diagnosis present

## 2023-11-04 DIAGNOSIS — G8929 Other chronic pain: Secondary | ICD-10-CM | POA: Insufficient documentation

## 2023-11-04 DIAGNOSIS — Z96612 Presence of left artificial shoulder joint: Secondary | ICD-10-CM | POA: Diagnosis present

## 2023-11-04 DIAGNOSIS — M25612 Stiffness of left shoulder, not elsewhere classified: Secondary | ICD-10-CM | POA: Diagnosis present

## 2023-11-04 NOTE — Therapy (Signed)
 OUTPATIENT PHYSICAL THERAPY SHOULDER TREATMENT   Patient Name: Belinda Frazier MRN: [id number] DOB:[birth year], 68 y.o., female Today's Date: 11/04/2023   END OF SESSION:  PT End of Session - 11/04/23 1427     Visit Number 4    Number of Visits 16    Date for Recertification  12/06/23    PT Start Time 1427    PT Stop Time 1510    PT Time Calculation (min) 43 min    Activity Tolerance Patient limited by fatigue;Patient tolerated treatment well;No increased pain    Behavior During Therapy WFL for tasks assessed/performed               Past Medical History:  Diagnosis Date   Anemia     Arthritis     Breast cancer (HCC) left breast    bilateral mastectomy   Chronic kidney disease      stage III   Diabetes mellitus without complication [facility])               Past Surgical History:  Procedure Laterality Date   CESAREAN SECTION   1990    x2 [phone]   CHOLECYSTECTOMY        2019   MASTECTOMY, RADICAL Bilateral 1996   TOTAL KNEE ARTHROPLASTY Left 12/21/2022    Procedure: TOTAL KNEE ARTHROPLASTY;  Surgeon: Ernie Cough, MD;  Location: [facility];  Service: Orthopedics;  Laterality: Left;            Patient Active Problem List    Diagnosis Date Noted   S/P total knee arthroplasty, 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      PCP: Sherial Bail, MD   REFERRING PROVIDER: Kay Kemps, MD   REFERRING DIAG: [phone] (ICD-10-CM) - Encounter for other specified surgical aftercare    Rationale for Evaluation and Treatment: Rehabilitation   THERAPY DIAG:  Muscle weakness (generalized)   Shoulder joint stiffness, left   Chronic left shoulder pain   S/P reverse total shoulder arthroplasty, left   ONSET DATE: s/p left reverse total shoulder arthroplasty August 18th, 2025   SUBJECTIVE:                                                                                                                                                                                            SUBJECTIVE  STATEMENT: Pt. Is a 68 year old female presenting to physical therapy s/p reverse total shoulder arthroplasty of left shoulder on (09/19/23). Pt. Experiencing ~1-2/10 twitch/twinging type of pain over the anterior shaft of the humerus. Pt. States that this number can increase to a slightly higher number (3/10 NPS) if she is reaching overhead or internally  rotating her arm when changing clothes or washing dishes. Pt. Denies any distal symptoms, numbness/tingling or burning sensations, pain over the humeral head or upper traps region, or any recent falls. Pt. Does report mild discomfort at the site of the incision and states that the inferior aspect of the incision feels hard. Pt. States that she is no longer taking any pain medication and that she only had to take Hydrocodone immediately after the surgery on 09/09. Pt. Reports that she currently has no post-surgical restrictions other than lifting heavy objects. Pt. States that she was told by MD that she can return to driving (sticking to shorter durations at this time) but she has yet to attempt. Pt. 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    PERTINENT HISTORY:  Pt. Previously seen in physical therapy for left shoulder pain after a trip/fall over her dog in March of 2025.    PAIN:  Are you having pain? Yes: NPRS scale: 2/10 Pain location: Anterior shaft of left humerus  Pain description: Twitching type ache Aggravating factors: Reaching tasks involving shoulder flexion or IR such as reaching for top cabinets, taking off jacket, or combing her hair Relieving factors: Rest, ice on rare occasions   PRECAUTIONS:  None   RED FLAGS: None       WEIGHT BEARING RESTRICTIONS:  No   FALLS:  Has patient fallen in last 6 months? No   LIVING ENVIRONMENT: Lives with: lives with their spouse Lives in: House/apartment Stairs: Yes: External: 7 steps; bilateral but cannot reach both Has  following equipment at home: Single point cane, Walker - 2 wheeled, and Ramped entry   OCCUPATION:  Sports coach for [employer].   PLOF:  Independent   PATIENT GOALS:  To increase strength and ROM of left shoulder so that she can drive, wash dishes, cook, and change her clothes without pain.    NEXT MD VISIT: October 7th, 2025     OBJECTIVE:  Note: Objective measures were completed at Evaluation unless otherwise noted.   PATIENT SURVEYS:  Will assess QuickDash during next session.    COGNITIVE STATUS: Within functional limits for tasks assessed        SENSATION: Light touch: WFL   SKIN INTEGRITY:             Incision site demonstrating proper healing   POSTURE:  rounded shoulders and forward head   PALPATION: Mild tenderness noted over left anterior deltoid into proximal biceps region   HAND DOMINANCE:  Right   UPPER EXTREMITY ROM:    Active ROM Right eval Left eval  Shoulder flexion 185 deg.  122 deg.  Shoulder extension      Shoulder abduction   160 deg. 89 deg.  Shoulder adduction      Shoulder internal rotation   76 deg.  70 deg.  Shoulder external rotation   91 deg. 40 deg.  Elbow flexion      Elbow extension      Wrist flexion      Wrist extension      Wrist ulnar deviation      Wrist radial deviation      Wrist pronation      Wrist supination      (Blank rows = not tested)   UPPER EXTREMITY MMT:   MMT Right eval Left eval  Shoulder flexion 4  Deferred  Shoulder extension      Shoulder abduction  4- Deferred  Shoulder adduction      Shoulder internal rotation 4+ 3 *  Shoulder external rotation  4+  3 *  Middle trapezius      Lower trapezius      Elbow flexion  4+ 4- *  Elbow extension  4+  4  Wrist flexion      Wrist extension      Wrist ulnar deviation      Wrist radial deviation      Wrist pronation 5  5   Wrist supination  5  5  Grip strength (lbs)      (Blank rows = not tested)             Cervical AROM  Assessment: Flexion, Bilateral flexion, bilateral lateral rotation, extension: [facility]  10/18/23 QuickDash: 18.2%                                                                                                           TREATMENT DATE: 11/04/2023  Subjective: Pt. Reports a mild ~1/10 pain in the anterior aspect of her left shoulder. Pt reports that she was without significant pain during her 2 week trip   Manual: Supine PROM of left shoulder: flexion, scaption, abduction, ER, IR (no increased in pain)   Therapeutic Ex.: Supine active left shoulder flexion with gentle overpressure applied at end range, 1x8  Supine rhythmic stabilization while holding 4# medicine ball with shoulder flexed to 90 degrees, 2x30 seconds  Supine Active D2 shoulder flexion pattern against gentle manual resistance from therapist, 1x8  Supine Isometric flexion+extension against manual resistance from therapist, 3x10 each  Staning ER/IR against RTB, 2x10 each  Standing left shoulder flexion with ladder, 1x8  Supine bicep curls, 5# dumbbell, 2x5 (terminated exercise due to pain increase)  Standing Nautilus Retractions (2x15) and Tricep extensions (2x8), 40# each  Seated shoulder extension with RTB, 2x20  11/04/2023 MMT Right eval Left eval  Shoulder flexion 4  3+ (pain)  Shoulder extension      Shoulder abduction  4- 4-  Shoulder adduction      Shoulder internal rotation 4+ 3+   Shoulder external rotation  4+  3 +  Middle trapezius      Lower trapezius      Elbow flexion  4+ 4-  Elbow extension  4+  4+  Wrist flexion      Wrist extension      Wrist ulnar deviation      Wrist radial deviation      Wrist pronation 5  5   Wrist supination  5  5  Grip strength (lbs)      (Blank rows = not tested)    Active ROM Right eval Left eval  Shoulder flexion 185 deg.  139 deg.  Shoulder extension      Shoulder abduction   160 deg. 135 deg.  Shoulder adduction      Shoulder internal rotation   76 deg.   70 deg.  Shoulder external rotation   91 deg. 60 deg.    PATIENT  EDUCATION:  Education details: Pt. Educated on diagnosis, prognosis, anatomy involved, and POC. Pt. And husband of pt. Educated on HEP. Person educated: Patient and Spouse Education method: Explanation, Demonstration, Tactile cues, Verbal cues, and Handouts Education comprehension: verbalized understanding and returned demonstration   HOME EXERCISE PROGRAM: Access Code: [email or web address] URL: [email or web address] Date: 10/11/2023 Prepared by: Ozell Sero Exercises - Supine Shoulder Flexion Extension AAROM with Dowel - 1 x daily - 7 x weekly - 2 sets - 10 reps - Seated Shoulder Abduction AAROM with Dowel - 1 x daily - 7 x weekly - 2 sets - 10 reps - Seated Scapular Retraction - 1 x daily - 4-5 x weekly - 2 sets - 12 reps - Standing Isometric Shoulder External Rotation with Doorway - 1 x daily - 4-5 x weekly - 2 sets - 8 reps - Standing Isometric Shoulder Abduction with Doorway - Arm Bent - 1 x daily - 4-5 x weekly - 2 sets - 8 reps    Access Code: [email or web address] URL: [email or web address] Date: 10/18/2023 Prepared by: Ozell Sero Exercises - Supine Shoulder Flexion Extension AAROM with Dowel - 1 x daily - 7 x weekly - 2 sets - 10 reps - Seated Shoulder Abduction AAROM with Dowel - 1 x daily - 7 x weekly - 2 sets - 10 reps - Standing Isometric Shoulder External Rotation with Doorway - 1 x daily - 4-5 x weekly - 2 sets - 8 reps - Standing Isometric Shoulder Abduction with Doorway - Arm Bent - 1 x daily - 4-5 x weekly - 2 sets - 8 reps - Seated Shoulder Row with Anchored Resistance - 1 x daily - 4-5 x weekly - 2 sets - 12 reps - Seated Shoulder Extension and Scapular Retraction with Resistance - 1 x daily - 4-5 x weekly - 2 sets - 12 reps - Supine Bilateral Elbow Flexion Extension with Dumbbells - 1 x daily - 7 x weekly - 3 sets - 8 reps - Supine Scapular Protraction in Flexion with Dumbbells - 1 x daily - 7 x weekly - 2 sets  - 10 reps    ASSESSMENT:   CLINICAL IMPRESSION: Pt introduced to standing scapular retractions and tricep extensions with Nautilus machine on this day which she was able to complete without significant difficulty, pain, or fatigue noted. Pt was progressed to completing ER and IR strengthening exercises against RTB on this day which she was able to complete but did experience difficulty with ER. Pt progressed to seated bicep curls with 5# DB on this day but unable to do entire intended number of sets/reps due to increased anterior left proximal humerus pain so exercise was terminated. Pain levels had returned to baseline after a few seconds of rest. Updated MMTs and AROM on this day (see above). Will continue to monitor pt. Symptoms and progress as able.      OBJECTIVE IMPAIRMENTS: decreased ROM, decreased strength, and pain.    ACTIVITY LIMITATIONS: carrying, lifting, and dressing   PARTICIPATION LIMITATIONS: meal prep, cleaning, laundry, driving, and occupation   PERSONAL FACTORS: 1-2 comorbidities: CKD and DM are also affecting patient's functional outcome.    REHAB POTENTIAL: Good   CLINICAL DECISION MAKING: Evolving/moderate complexity   EVALUATION COMPLEXITY: Moderate     GOALS: Goals reviewed with patient? Yes   SHORT TERM GOALS: Target date: 11/01/23   Pt. To become independent with HEP to increase active left shoulder abduction to at least 100 deg. to improve functional reaching/ ADLs. Baseline: Goal status: Goal met  2.  Pt. will improve gross left UE strength to at least a 4/5 in order to improve ability to drive her car which she does a lot of for work.  Baseline:  Goal status: Partially met   LONG TERM GOALS: Target date: 12/06/23   Pt. Will improve QuickDash score by at least 8 points (MCID) in order for pt. To allow for completion of  daily activities with greater ease. Baseline: 09/16: 18.2% Goal status: On-going   2.  Pt. To experience 0/10 pain during  functional care activities while at home such as washing dishes, cooking, and cleaning.  Baseline: ~3/10 twitch pain Goal status: INITIAL   3.  Pt. to be able to actively elevate left shoulder to at least 140 degrees to improve ability to reach for items in top cabinets of kitchen and bathroom.  Baseline: 122 deg. of active flexion Goal status: INITIAL   PLAN:   PT FREQUENCY: 2x/week   PT DURATION: 8 weeks   PLANNED INTERVENTIONS: 97164- PT Re-evaluation, 97110-Therapeutic exercises, 97530- Therapeutic activity, [id number]- Neuromuscular re-education, 97535- Self Care, [id number]- Manual therapy, Patient/Family education, Joint mobilization, Cryotherapy, and Moist heat.   PLAN FOR NEXT SESSION: Increase periscapular endurance muscle capacity with increased reps for next session.     Ozell JAYSON Sero, PT, DPT # [id number] Curtistine Bracket, SPT 11/04/2023

## 2023-11-08 ENCOUNTER — Encounter: Admitting: Physical Therapy

## 2023-11-10 ENCOUNTER — Encounter: Admitting: Physical Therapy

## 2023-11-11 ENCOUNTER — Ambulatory Visit: Admitting: Physical Therapy

## 2023-11-15 ENCOUNTER — Ambulatory Visit: Admitting: Physical Therapy

## 2023-11-15 DIAGNOSIS — Z96612 Presence of left artificial shoulder joint: Secondary | ICD-10-CM

## 2023-11-15 DIAGNOSIS — G8929 Other chronic pain: Secondary | ICD-10-CM

## 2023-11-15 DIAGNOSIS — M25512 Pain in left shoulder: Secondary | ICD-10-CM | POA: Diagnosis not present

## 2023-11-15 DIAGNOSIS — M6281 Muscle weakness (generalized): Secondary | ICD-10-CM

## 2023-11-15 DIAGNOSIS — M25612 Stiffness of left shoulder, not elsewhere classified: Secondary | ICD-10-CM

## 2023-11-15 NOTE — Therapy (Unsigned)
 OUTPATIENT PHYSICAL THERAPY SHOULDER TREATMENT   Patient Name: Belinda Frazier MRN: [id number] DOB:Feb 23, 1955, 68 y.o., female Today's Date: 11/15/2023   END OF SESSION:  PT End of Session - 11/15/23 1113     Visit Number 5    Number of Visits 16    Date for Recertification  12/06/23    PT Start Time 1034    PT Stop Time 1115    PT Time Calculation (min) 41 min    Activity Tolerance Patient limited by fatigue;Patient tolerated treatment well;No increased pain    Behavior During Therapy WFL for tasks assessed/performed                Past Medical History:  Diagnosis Date   Anemia     Arthritis     Breast cancer (HCC) left breast    bilateral mastectomy   Chronic kidney disease      stage III   Diabetes mellitus without complication [facility])               Past Surgical History:  Procedure Laterality Date   CESAREAN SECTION   1990    x2 [phone]   CHOLECYSTECTOMY        2019   MASTECTOMY, RADICAL Bilateral 1996   TOTAL KNEE ARTHROPLASTY Left 12/21/2022    Procedure: TOTAL KNEE ARTHROPLASTY;  Surgeon: Ernie Cough, MD;  Location: [facility];  Service: Orthopedics;  Laterality: Left;            Patient Active Problem List    Diagnosis Date Noted   S/P total knee arthroplasty, 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      PCP: Sherial Bail, MD   REFERRING PROVIDER: Kay Kemps, MD   REFERRING DIAG: [phone] (ICD-10-CM) - Encounter for other specified surgical aftercare    Rationale for Evaluation and Treatment: Rehabilitation   THERAPY DIAG:  Muscle weakness (generalized)   Shoulder joint stiffness, left   Chronic left shoulder pain   S/P reverse total shoulder arthroplasty, left   ONSET DATE: s/p left reverse total shoulder arthroplasty August 18th, 2025   SUBJECTIVE:                                                                                                                                                                                            SUBJECTIVE  STATEMENT: Pt. Is a 68 year old female presenting to physical therapy s/p reverse total shoulder arthroplasty of left shoulder on (09/19/23). Pt. Experiencing ~1-2/10 twitch/twinging type of pain over the anterior shaft of the humerus. Pt. States that this number can increase to a slightly higher number (3/10 NPS) if she is reaching overhead or  internally rotating her arm when changing clothes or washing dishes. Pt. Denies any distal symptoms, numbness/tingling or burning sensations, pain over the humeral head or upper traps region, or any recent falls. Pt. Does report mild discomfort at the site of the incision and states that the inferior aspect of the incision feels hard. Pt. States that she is no longer taking any pain medication and that she only had to take Hydrocodone immediately after the surgery on 09/09. Pt. Reports that she currently has no post-surgical restrictions other than lifting heavy objects. Pt. States that she was told by MD that she can return to driving (sticking to shorter durations at this time) but she has yet to attempt. Pt. 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    PERTINENT HISTORY:  Pt. Previously seen in physical therapy for left shoulder pain after a trip/fall over her dog in March of 2025.    PAIN:  Are you having pain? Yes: NPRS scale: 2/10 Pain location: Anterior shaft of left humerus  Pain description: Twitching type ache Aggravating factors: Reaching tasks involving shoulder flexion or IR such as reaching for top cabinets, taking off jacket, or combing her hair Relieving factors: Rest, ice on rare occasions   PRECAUTIONS:  None   RED FLAGS: None       WEIGHT BEARING RESTRICTIONS:  No   FALLS:  Has patient fallen in last 6 months? No   LIVING ENVIRONMENT: Lives with: lives with their spouse Lives in: House/apartment Stairs: Yes: External: 7 steps; bilateral but cannot reach both Has  following equipment at home: Single point cane, Walker - 2 wheeled, and Ramped entry   OCCUPATION:  Sports coach for [employer].   PLOF:  Independent   PATIENT GOALS:  To increase strength and ROM of left shoulder so that she can drive, wash dishes, cook, and change her clothes without pain.    NEXT MD VISIT: October 7th, 2025     OBJECTIVE:  Note: Objective measures were completed at Evaluation unless otherwise noted.   PATIENT SURVEYS:  Will assess QuickDash during next session.    COGNITIVE STATUS: Within functional limits for tasks assessed        SENSATION: Light touch: WFL   SKIN INTEGRITY:             Incision site demonstrating proper healing   POSTURE:  rounded shoulders and forward head   PALPATION: Mild tenderness noted over left anterior deltoid into proximal biceps region   HAND DOMINANCE:  Right   UPPER EXTREMITY ROM:    Active ROM Right eval Left eval  Shoulder flexion 185 deg.  122 deg.  Shoulder extension      Shoulder abduction   160 deg. 89 deg.  Shoulder adduction      Shoulder internal rotation   76 deg.  70 deg.  Shoulder external rotation   91 deg. 40 deg.  Elbow flexion      Elbow extension      Wrist flexion      Wrist extension      Wrist ulnar deviation      Wrist radial deviation      Wrist pronation      Wrist supination      (Blank rows = not tested)   UPPER EXTREMITY MMT:   MMT Right eval Left eval  Shoulder flexion 4  Deferred  Shoulder extension      Shoulder abduction  4- Deferred  Shoulder adduction      Shoulder internal rotation 4+ 3 *  Shoulder external rotation  4+  3 *  Middle trapezius      Lower trapezius      Elbow flexion  4+ 4- *  Elbow extension  4+  4  Wrist flexion      Wrist extension      Wrist ulnar deviation      Wrist radial deviation      Wrist pronation 5  5   Wrist supination  5  5  Grip strength (lbs)      (Blank rows = not tested)             Cervical AROM  Assessment: Flexion, Bilateral flexion, bilateral lateral rotation, extension: [facility]  10/18/23 QuickDash: 18.2%       11/04/2023 MMT Right eval Left eval  Shoulder flexion 4  3+ (pain)  Shoulder extension      Shoulder abduction  4- 4-  Shoulder adduction      Shoulder internal rotation 4+ 3+   Shoulder external rotation  4+  3 +  Middle trapezius      Lower trapezius      Elbow flexion  4+ 4-  Elbow extension  4+  4+  Wrist flexion      Wrist extension      Wrist ulnar deviation      Wrist radial deviation      Wrist pronation 5  5   Wrist supination  5  5  Grip strength (lbs)      (Blank rows = not tested)    Active ROM Right eval Left eval  Shoulder flexion 185 deg.  139 deg.  Shoulder extension      Shoulder abduction   160 deg. 135 deg.  Shoulder adduction      Shoulder internal rotation   76 deg.  70 deg.  Shoulder external rotation   91 deg. 60 deg.                                                                                                        TREATMENT DATE: 11/15/2023  Subjective: Pt reports a mild 1/10 pain in the anterior aspect of her left shoulder. Pt reports that her MD was happy with her progress during her recent follow up appointment and that she should continue out her POC.   Manual: Supine PROM of left shoulder: flexion, scaption, abduction, ER, IR (no increased pain)   Therapeutic Ex.: Supine active left shoulder flexion with gentle overpressure applied at end range, 1x8  Supine rhythmic stabilization while holding 3# DB with shoulder flexed to 90 degrees, 2x30 seconds  Supine Active D2 shoulder flexion pattern against gentle manual resistance from therapist, 1x8  Supine flexion+extension against manual resistance from therapist, 3x10 each  Staning ER/IR against RTB, 2x10 each  Standing left shoulder flexion with ladder, 1x8  Supine bicep curls and tricep extensions, 3# dumbbell, 3x8   Standing scaption with 3# dumbbells,  2x10  Standing Nautilus Retractions (2x15) and Tricep  extensions (2x8), 40# each  Seated shoulder extension with RTB, 2x20   PATIENT EDUCATION:  Education details: Pt. Educated on diagnosis, prognosis, anatomy involved, and POC. Pt. And husband of pt. Educated on HEP. Person educated: Patient and Spouse Education method: Explanation, Demonstration, Tactile cues, Verbal cues, and Handouts Education comprehension: verbalized understanding and returned demonstration   HOME EXERCISE PROGRAM: Access Code: [email or web address] URL: [email or web address] Date: 10/11/2023 Prepared by: Ozell Sero Exercises - Supine Shoulder Flexion Extension AAROM with Dowel - 1 x daily - 7 x weekly - 2 sets - 10 reps - Seated Shoulder Abduction AAROM with Dowel - 1 x daily - 7 x weekly - 2 sets - 10 reps - Seated Scapular Retraction - 1 x daily - 4-5 x weekly - 2 sets - 12 reps - Standing Isometric Shoulder External Rotation with Doorway - 1 x daily - 4-5 x weekly - 2 sets - 8 reps - Standing Isometric Shoulder Abduction with Doorway - Arm Bent - 1 x daily - 4-5 x weekly - 2 sets - 8 reps    Access Code: [email or web address] URL: [email or web address] Date: 10/18/2023 Prepared by: Ozell Sero Exercises - Supine Shoulder Flexion Extension AAROM with Dowel - 1 x daily - 7 x weekly - 2 sets - 10 reps - Seated Shoulder Abduction AAROM with Dowel - 1 x daily - 7 x weekly - 2 sets - 10 reps - Standing Isometric Shoulder External Rotation with Doorway - 1 x daily - 4-5 x weekly - 2 sets - 8 reps - Standing Isometric Shoulder Abduction with Doorway - Arm Bent - 1 x daily - 4-5 x weekly - 2 sets - 8 reps - Seated Shoulder Row with Anchored Resistance - 1 x daily - 4-5 x weekly - 2 sets - 12 reps - Seated Shoulder Extension and Scapular Retraction with Resistance - 1 x daily - 4-5 x weekly - 2 sets - 12 reps - Supine Bilateral Elbow Flexion Extension with Dumbbells - 1 x daily - 7 x weekly - 3 sets - 8 reps - Supine Scapular  Protraction in Flexion with Dumbbells - 1 x daily - 7 x weekly - 2 sets - 10 reps    ASSESSMENT:   CLINICAL IMPRESSION: Pt was introduced to standing scaption with 3# DBs on this day which she was able to complete with mild discomfort over her proximal, anterior aspect of left shoulder region. Pt was progressed to completing flexion/extension in supine position against light manual, therapist resistance isotonically rather than isometrically on this day which she tolerated well but with noticeable strength deficits remaining in both directions. Pt also remains limited with AROM of ER as evident through limited range available with resisted band strengthening activity. Will continue to monitor pt. symptoms and progress as able.      OBJECTIVE IMPAIRMENTS: decreased ROM, decreased strength, and pain.    ACTIVITY LIMITATIONS: carrying, lifting, and dressing   PARTICIPATION LIMITATIONS: meal prep, cleaning, laundry, driving, and occupation   PERSONAL FACTORS: 1-2 comorbidities: CKD and DM are also affecting patient's functional outcome.    REHAB POTENTIAL: Good   CLINICAL DECISION MAKING: Evolving/moderate complexity   EVALUATION COMPLEXITY: Moderate     GOALS: Goals reviewed with patient? Yes   SHORT TERM GOALS: Target date: 11/01/23   Pt. To become independent with HEP to increase active left shoulder abduction to at least 100 deg. to improve functional reaching/ ADLs. Baseline: Goal status: Goal met   2.  Pt. will improve gross left UE strength to  at least a 4/5 in order to improve ability to drive her car which she does a lot of for work.  Baseline:  Goal status: Partially met   LONG TERM GOALS: Target date: 12/06/23   Pt. Will improve QuickDash score by at least 8 points (MCID) in order for pt. To allow for completion of  daily activities with greater ease. Baseline: 09/16: 18.2% Goal status: On-going   2.  Pt. To experience 0/10 pain during functional care activities while  at home such as washing dishes, cooking, and cleaning.  Baseline: ~3/10 twitch pain Goal status: INITIAL   3.  Pt. to be able to actively elevate left shoulder to at least 140 degrees to improve ability to reach for items in top cabinets of kitchen and bathroom.  Baseline: 122 deg. of active flexion Goal status: INITIAL   PLAN:   PT FREQUENCY: 2x/week   PT DURATION: 8 weeks   PLANNED INTERVENTIONS: 97164- PT Re-evaluation, 97110-Therapeutic exercises, 97530- Therapeutic activity, [id number]- Neuromuscular re-education, 97535- Self Care, [id number]- Manual therapy, Patient/Family education, Joint mobilization, Cryotherapy, and Moist heat.   PLAN FOR NEXT SESSION: Increase periscapular endurance muscle capacity with increased time/reps for next session.     Ozell JAYSON Sero, PT, DPT # [id number] Curtistine Bracket, SPT 11/15/2023

## 2023-11-16 ENCOUNTER — Encounter: Payer: Self-pay | Admitting: Physical Therapy

## 2023-11-17 ENCOUNTER — Encounter: Admitting: Physical Therapy

## 2023-11-18 ENCOUNTER — Ambulatory Visit: Admitting: Physical Therapy

## 2023-11-21 ENCOUNTER — Ambulatory Visit: Admitting: Physical Therapy

## 2023-11-21 DIAGNOSIS — Z96612 Presence of left artificial shoulder joint: Secondary | ICD-10-CM

## 2023-11-21 DIAGNOSIS — M6281 Muscle weakness (generalized): Secondary | ICD-10-CM

## 2023-11-21 DIAGNOSIS — G8929 Other chronic pain: Secondary | ICD-10-CM

## 2023-11-21 DIAGNOSIS — M25612 Stiffness of left shoulder, not elsewhere classified: Secondary | ICD-10-CM

## 2023-11-21 DIAGNOSIS — M25512 Pain in left shoulder: Secondary | ICD-10-CM | POA: Diagnosis not present

## 2023-11-21 NOTE — Therapy (Signed)
 OUTPATIENT PHYSICAL THERAPY SHOULDER TREATMENT   Patient Name: Belinda Frazier MRN: [id number] DOB:07/04/1955, 68 y.o., female Today's Date: 11/21/2023   END OF SESSION:  PT End of Session - 11/21/23 1120     Visit Number 6    Number of Visits 16    Date for Recertification  12/06/23    PT Start Time 1030    PT Stop Time 1114    PT Time Calculation (min) 44 min    Activity Tolerance Patient limited by fatigue;Patient tolerated treatment well;No increased pain    Behavior During Therapy WFL for tasks assessed/performed              Past Medical History:  Diagnosis Date   Anemia     Arthritis     Breast cancer (HCC) left breast    bilateral mastectomy   Chronic kidney disease      stage III   Diabetes mellitus without complication [facility])               Past Surgical History:  Procedure Laterality Date   CESAREAN SECTION   1990    x2 [phone]   CHOLECYSTECTOMY        2019   MASTECTOMY, RADICAL Bilateral 1996   TOTAL KNEE ARTHROPLASTY Left 12/21/2022    Procedure: TOTAL KNEE ARTHROPLASTY;  Surgeon: Ernie Cough, MD;  Location: [facility];  Service: Orthopedics;  Laterality: Left;            Patient Active Problem List    Diagnosis Date Noted   S/P total knee arthroplasty, 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      PCP: Sherial Bail, MD   REFERRING PROVIDER: Kay Kemps, MD   REFERRING DIAG: [phone] (ICD-10-CM) - Encounter for other specified surgical aftercare    Rationale for Evaluation and Treatment: Rehabilitation   THERAPY DIAG:  Muscle weakness (generalized)   Shoulder joint stiffness, left   Chronic left shoulder pain   S/P reverse total shoulder arthroplasty, left   ONSET DATE: s/p left reverse total shoulder arthroplasty August 18th, 2025   SUBJECTIVE:                                                                                                                                                                                            SUBJECTIVE  STATEMENT: Pt. Is a 68 year old female presenting to physical therapy s/p reverse total shoulder arthroplasty of left shoulder on (09/19/23). Pt. Experiencing ~1-2/10 twitch/twinging type of pain over the anterior shaft of the humerus. Pt. States that this number can increase to a slightly higher number (3/10 NPS) if she is reaching overhead or internally rotating  her arm when changing clothes or washing dishes. Pt. Denies any distal symptoms, numbness/tingling or burning sensations, pain over the humeral head or upper traps region, or any recent falls. Pt. Does report mild discomfort at the site of the incision and states that the inferior aspect of the incision feels hard. Pt. States that she is no longer taking any pain medication and that she only had to take Hydrocodone immediately after the surgery on 09/09. Pt. Reports that she currently has no post-surgical restrictions other than lifting heavy objects. Pt. States that she was told by MD that she can return to driving (sticking to shorter durations at this time) but she has yet to attempt. Pt. 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    PERTINENT HISTORY:  Pt. Previously seen in physical therapy for left shoulder pain after a trip/fall over her dog in March of 2025.    PAIN:  Are you having pain? Yes: NPRS scale: 2/10 Pain location: Anterior shaft of left humerus  Pain description: Twitching type ache Aggravating factors: Reaching tasks involving shoulder flexion or IR such as reaching for top cabinets, taking off jacket, or combing her hair Relieving factors: Rest, ice on rare occasions   PRECAUTIONS:  None   RED FLAGS: None       WEIGHT BEARING RESTRICTIONS:  No   FALLS:  Has patient fallen in last 6 months? No   LIVING ENVIRONMENT: Lives with: lives with their spouse Lives in: House/apartment Stairs: Yes: External: 7 steps; bilateral but cannot reach both Has  following equipment at home: Single point cane, Walker - 2 wheeled, and Ramped entry   OCCUPATION:  Sports coach for [employer].   PLOF:  Independent   PATIENT GOALS:  To increase strength and ROM of left shoulder so that she can drive, wash dishes, cook, and change her clothes without pain.    NEXT MD VISIT: October 7th, 2025     OBJECTIVE:  Note: Objective measures were completed at Evaluation unless otherwise noted.   PATIENT SURVEYS:  Will assess QuickDash during next session.    COGNITIVE STATUS: Within functional limits for tasks assessed        SENSATION: Light touch: WFL   SKIN INTEGRITY:             Incision site demonstrating proper healing   POSTURE:  rounded shoulders and forward head   PALPATION: Mild tenderness noted over left anterior deltoid into proximal biceps region   HAND DOMINANCE:  Right   UPPER EXTREMITY ROM:    Active ROM Right eval Left eval  Shoulder flexion 185 deg.  122 deg.  Shoulder extension      Shoulder abduction   160 deg. 89 deg.  Shoulder adduction      Shoulder internal rotation   76 deg.  70 deg.  Shoulder external rotation   91 deg. 40 deg.  Elbow flexion      Elbow extension      Wrist flexion      Wrist extension      Wrist ulnar deviation      Wrist radial deviation      Wrist pronation      Wrist supination      (Blank rows = not tested)   UPPER EXTREMITY MMT:   MMT Right eval Left eval  Shoulder flexion 4  Deferred  Shoulder extension      Shoulder abduction  4-  Deferred  Shoulder adduction      Shoulder internal rotation 4+ 3 *  Shoulder external rotation  4+  3 *  Middle trapezius      Lower trapezius      Elbow flexion  4+ 4- *  Elbow extension  4+  4  Wrist flexion      Wrist extension      Wrist ulnar deviation      Wrist radial deviation      Wrist pronation 5  5   Wrist supination  5  5  Grip strength (lbs)      (Blank rows = not tested)             Cervical AROM  Assessment: Flexion, Bilateral flexion, bilateral lateral rotation, extension: [facility]  10/18/23 QuickDash: 18.2%       11/04/2023 MMT Right eval Left eval  Shoulder flexion 4  3+ (pain)  Shoulder extension      Shoulder abduction  4- 4-  Shoulder adduction      Shoulder internal rotation 4+ 3+   Shoulder external rotation  4+  3 +  Middle trapezius      Lower trapezius      Elbow flexion  4+ 4-  Elbow extension  4+  4+  Wrist flexion      Wrist extension      Wrist ulnar deviation      Wrist radial deviation      Wrist pronation 5  5   Wrist supination  5  5  Grip strength (lbs)      (Blank rows = not tested)    Active ROM Right eval Left eval  Shoulder flexion 185 deg.  139 deg.  Shoulder extension      Shoulder abduction   160 deg. 135 deg.  Shoulder adduction      Shoulder internal rotation   76 deg.  70 deg.  Shoulder external rotation   91 deg. 60 deg.                                                                                                        TREATMENT DATE: 11/21/2023  Subjective: Pt reports no pain at rest in her left shoulder at start of tx session. Pt reports having recently returned to the gym and has been gradually increasing her tolerance to certain exercises but has been able to do bicep curls and lateral dumbbell raises.   Manual: Supine PROM of left shoulder: flexion, scaption, abduction, ER, IR (no increased pain)   Therapeutic Ex.: Supine active left shoulder flexion with gentle overpressure applied at end range, 1x8  Supine rhythmic stabilization while holding 4# DB with shoulder flexed to 90 degrees, 2x30 seconds  Supine Serratus Punches, 4#, 2 x 10  Supine Active D2 shoulder flexion pattern against gentle manual resistance from therapist, 1x8  Supine flexion+extension against manual resistance from therapist, 3x10 each  Staning ER/IR against RTB, 2x10 each  Supine bicep curls and tricep extensions, 5# dumbbell, 2 x 10  Standing  Nautilus Retractions (2x15) and Tricep  extensions (2x8), 40# each  Standing Nautilus Pulldowns, 30#, 2 x 10   PATIENT EDUCATION:  Education details: Pt. Educated on diagnosis, prognosis, anatomy involved, and POC. Pt. And husband of pt. Educated on HEP. Person educated: Patient and Spouse Education method: Explanation, Demonstration, Tactile cues, Verbal cues, and Handouts Education comprehension: verbalized understanding and returned demonstration   HOME EXERCISE PROGRAM: Access Code: [email or web address] URL: [email or web address] Date: 10/11/2023 Prepared by: Ozell Sero Exercises - Supine Shoulder Flexion Extension AAROM with Dowel - 1 x daily - 7 x weekly - 2 sets - 10 reps - Seated Shoulder Abduction AAROM with Dowel - 1 x daily - 7 x weekly - 2 sets - 10 reps - Seated Scapular Retraction - 1 x daily - 4-5 x weekly - 2 sets - 12 reps - Standing Isometric Shoulder External Rotation with Doorway - 1 x daily - 4-5 x weekly - 2 sets - 8 reps - Standing Isometric Shoulder Abduction with Doorway - Arm Bent - 1 x daily - 4-5 x weekly - 2 sets - 8 reps    Access Code: [email or web address] URL: [email or web address] Date: 10/18/2023 Prepared by: Ozell Sero Exercises - Supine Shoulder Flexion Extension AAROM with Dowel - 1 x daily - 7 x weekly - 2 sets - 10 reps - Seated Shoulder Abduction AAROM with Dowel - 1 x daily - 7 x weekly - 2 sets - 10 reps - Standing Isometric Shoulder External Rotation with Doorway - 1 x daily - 4-5 x weekly - 2 sets - 8 reps - Standing Isometric Shoulder Abduction with Doorway - Arm Bent - 1 x daily - 4-5 x weekly - 2 sets - 8 reps - Seated Shoulder Row with Anchored Resistance - 1 x daily - 4-5 x weekly - 2 sets - 12 reps - Seated Shoulder Extension and Scapular Retraction with Resistance - 1 x daily - 4-5 x weekly - 2 sets - 12 reps - Supine Bilateral Elbow Flexion Extension with Dumbbells - 1 x daily - 7 x weekly - 3 sets - 8 reps - Supine Scapular Protraction in  Flexion with Dumbbells - 1 x daily - 7 x weekly - 2 sets - 10 reps    ASSESSMENT:   CLINICAL IMPRESSION: Pt was progressed to completing seated bicep curls, supine tricep skull crushers, and supine serratus punches for increased resistance on this day which she tolerated well. Pt did experience fatigue with supine serratus punches on this day and slight discomfort over anterior, proximal aspect of left shoulder. Pt unable to perform standing pulldowns on Nautilus with initial 40# weight so exercise was modified to 30# which she was able to tolerate. Pt continues to demonstrate improvements with UE strengthening and ROM but is most limited by active left shoulder abduction at this time. Will continue to monitor pt. symptoms and progress as able.      OBJECTIVE IMPAIRMENTS: decreased ROM, decreased strength, and pain.    ACTIVITY LIMITATIONS: carrying, lifting, and dressing   PARTICIPATION LIMITATIONS: meal prep, cleaning, laundry, driving, and occupation   PERSONAL FACTORS: 1-2 comorbidities: CKD and DM are also affecting patient's functional outcome.    REHAB POTENTIAL: Good   CLINICAL DECISION MAKING: Evolving/moderate complexity   EVALUATION COMPLEXITY: Moderate     GOALS: Goals reviewed with patient? Yes   SHORT TERM GOALS: Target date: 11/01/23   Pt. To become independent with HEP to increase active left shoulder abduction to at least 100 deg. to improve functional reaching/ ADLs. Baseline: Goal status: Goal met  2.  Pt. will improve gross left UE strength to at least a 4/5 in order to improve ability to drive her car which she does a lot of for work.  Baseline:  Goal status: Partially met   LONG TERM GOALS: Target date: 12/06/23   Pt. Will improve QuickDash score by at least 8 points (MCID) in order for pt. To allow for completion of  daily activities with greater ease. Baseline: 09/16: 18.2% Goal status: On-going   2.  Pt. To experience 0/10 pain during functional care  activities while at home such as washing dishes, cooking, and cleaning.  Baseline: ~3/10 twitch pain Goal status: INITIAL   3.  Pt. to be able to actively elevate left shoulder to at least 140 degrees to improve ability to reach for items in top cabinets of kitchen and bathroom.  Baseline: 122 deg. of active flexion Goal status: INITIAL   PLAN:   PT FREQUENCY: 2x/week   PT DURATION: 8 weeks   PLANNED INTERVENTIONS: 97164- PT Re-evaluation, 97110-Therapeutic exercises, 97530- Therapeutic activity, [id number]- Neuromuscular re-education, 97535- Self Care, [id number]- Manual therapy, Patient/Family education, Joint mobilization, Cryotherapy, and Moist heat.   PLAN FOR NEXT SESSION: Increase periscapular endurance muscle capacity with increased time/reps for next session. Plan is for Monday 27th, 2025 to be final visit as patient will be leaving for 3 week vacation.    Ozell JAYSON Sero, PT, DPT # [id number] Curtistine Bracket, SPT 11/21/2023

## 2023-11-22 ENCOUNTER — Encounter: Admitting: Physical Therapy

## 2023-11-24 ENCOUNTER — Encounter: Admitting: Physical Therapy

## 2023-11-25 ENCOUNTER — Ambulatory Visit: Admitting: Physical Therapy

## 2023-11-25 DIAGNOSIS — M6281 Muscle weakness (generalized): Secondary | ICD-10-CM

## 2023-11-25 DIAGNOSIS — M25512 Pain in left shoulder: Secondary | ICD-10-CM | POA: Diagnosis not present

## 2023-11-25 DIAGNOSIS — M25612 Stiffness of left shoulder, not elsewhere classified: Secondary | ICD-10-CM

## 2023-11-25 DIAGNOSIS — G8929 Other chronic pain: Secondary | ICD-10-CM

## 2023-11-25 DIAGNOSIS — Z96612 Presence of left artificial shoulder joint: Secondary | ICD-10-CM

## 2023-11-25 NOTE — Therapy (Signed)
 OUTPATIENT PHYSICAL THERAPY SHOULDER TREATMENT   Patient Name: Belinda Frazier MRN: [id number] DOB:October 28, 1955, 68 y.o., female Today's Date: 11/25/2023   END OF SESSION:  PT End of Session - 11/25/23 1215     Visit Number 7    Number of Visits 16    Date for Recertification  12/06/23    PT Start Time 0945    PT Stop Time 1030    PT Time Calculation (min) 45 min    Activity Tolerance Patient tolerated treatment well;No increased pain;Patient limited by fatigue    Behavior During Therapy [facility] for tasks assessed/performed               Past Medical History:  Diagnosis Date   Anemia     Arthritis     Breast cancer (HCC) left breast    bilateral mastectomy   Chronic kidney disease      stage III   Diabetes mellitus without complication [facility])               Past Surgical History:  Procedure Laterality Date   CESAREAN SECTION   1990    x2 [phone]   CHOLECYSTECTOMY        2019   MASTECTOMY, RADICAL Bilateral 1996   TOTAL KNEE ARTHROPLASTY Left 12/21/2022    Procedure: TOTAL KNEE ARTHROPLASTY;  Surgeon: Ernie Cough, MD;  Location: [facility];  Service: Orthopedics;  Laterality: Left;            Patient Active Problem List    Diagnosis Date Noted   S/P total knee arthroplasty, 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      PCP: Sherial Bail, MD   REFERRING PROVIDER: Kay Kemps, MD   REFERRING DIAG: [phone] (ICD-10-CM) - Encounter for other specified surgical aftercare    Rationale for Evaluation and Treatment: Rehabilitation   THERAPY DIAG:  Muscle weakness (generalized)   Shoulder joint stiffness, left   Chronic left shoulder pain   S/P reverse total shoulder arthroplasty, left   ONSET DATE: s/p left reverse total shoulder arthroplasty August 18th, 2025   SUBJECTIVE:                                                                                                                                                                                            SUBJECTIVE  STATEMENT: Pt. Is a 68 year old female presenting to physical therapy s/p reverse total shoulder arthroplasty of left shoulder on (09/19/23). Pt. Experiencing ~1-2/10 twitch/twinging type of pain over the anterior shaft of the humerus. Pt. States that this number can increase to a slightly higher number (3/10 NPS) if she is reaching overhead or internally  rotating her arm when changing clothes or washing dishes. Pt. Denies any distal symptoms, numbness/tingling or burning sensations, pain over the humeral head or upper traps region, or any recent falls. Pt. Does report mild discomfort at the site of the incision and states that the inferior aspect of the incision feels hard. Pt. States that she is no longer taking any pain medication and that she only had to take Hydrocodone immediately after the surgery on 09/09. Pt. Reports that she currently has no post-surgical restrictions other than lifting heavy objects. Pt. States that she was told by MD that she can return to driving (sticking to shorter durations at this time) but she has yet to attempt. Pt. 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    PERTINENT HISTORY:  Pt. Previously seen in physical therapy for left shoulder pain after a trip/fall over her dog in March of 2025.    PAIN:  Are you having pain? Yes: NPRS scale: 2/10 Pain location: Anterior shaft of left humerus  Pain description: Twitching type ache Aggravating factors: Reaching tasks involving shoulder flexion or IR such as reaching for top cabinets, taking off jacket, or combing her hair Relieving factors: Rest, ice on rare occasions   PRECAUTIONS:  None   RED FLAGS: None       WEIGHT BEARING RESTRICTIONS:  No   FALLS:  Has patient fallen in last 6 months? No   LIVING ENVIRONMENT: Lives with: lives with their spouse Lives in: House/apartment Stairs: Yes: External: 7 steps; bilateral but cannot reach both Has  following equipment at home: Single point cane, Walker - 2 wheeled, and Ramped entry   OCCUPATION:  Sports coach for [employer].   PLOF:  Independent   PATIENT GOALS:  To increase strength and ROM of left shoulder so that she can drive, wash dishes, cook, and change her clothes without pain.    NEXT MD VISIT: October 7th, 2025     OBJECTIVE:  Note: Objective measures were completed at Evaluation unless otherwise noted.   PATIENT SURVEYS:  Will assess QuickDash during next session.    COGNITIVE STATUS: Within functional limits for tasks assessed        SENSATION: Light touch: WFL   SKIN INTEGRITY:             Incision site demonstrating proper healing   POSTURE:  rounded shoulders and forward head   PALPATION: Mild tenderness noted over left anterior deltoid into proximal biceps region   HAND DOMINANCE:  Right   UPPER EXTREMITY ROM:    Active ROM Right eval Left eval  Shoulder flexion 185 deg.  122 deg.  Shoulder extension      Shoulder abduction   160 deg. 89 deg.  Shoulder adduction      Shoulder internal rotation   76 deg.  70 deg.  Shoulder external rotation   91 deg. 40 deg.  Elbow flexion      Elbow extension      Wrist flexion      Wrist extension      Wrist ulnar deviation      Wrist radial deviation      Wrist pronation      Wrist supination      (Blank rows = not tested)   UPPER EXTREMITY MMT:   MMT Right eval Left eval  Shoulder flexion 4  Deferred  Shoulder extension      Shoulder abduction  4- Deferred  Shoulder adduction      Shoulder internal rotation 4+ 3 *  Shoulder external rotation  4+  3 *  Middle trapezius      Lower trapezius      Elbow flexion  4+ 4- *  Elbow extension  4+  4  Wrist flexion      Wrist extension      Wrist ulnar deviation      Wrist radial deviation      Wrist pronation 5  5   Wrist supination  5  5  Grip strength (lbs)      (Blank rows = not tested)             Cervical AROM  Assessment: Flexion, Bilateral flexion, bilateral lateral rotation, extension: [facility]  10/18/23 QuickDash: 18.2%       11/04/2023 MMT Right eval Left eval  Shoulder flexion 4  3+ (pain)  Shoulder extension      Shoulder abduction  4- 4-  Shoulder adduction      Shoulder internal rotation 4+ 3+   Shoulder external rotation  4+  3 +  Middle trapezius      Lower trapezius      Elbow flexion  4+ 4-  Elbow extension  4+  4+  Wrist flexion      Wrist extension      Wrist ulnar deviation      Wrist radial deviation      Wrist pronation 5  5   Wrist supination  5  5  Grip strength (lbs)      (Blank rows = not tested)    Active ROM Right eval Left eval  Shoulder flexion 185 deg.  139 deg.  Shoulder extension      Shoulder abduction   160 deg. 135 deg.  Shoulder adduction      Shoulder internal rotation   76 deg.  70 deg.  Shoulder external rotation   91 deg. 60 deg.                                                                                                        TREATMENT DATE: 11/25/2023  Subjective: Pt reports no pain at rest in her left shoulder at start of tx session. Pt reports continued adherance to HEP and no new concerns at start of tx session.  Manual: Supine PROM of left shoulder: flexion, scaption, abduction, ER, IR (no increased pain) STM to left upper traps region with use of Hypervolt in seated position   Therapeutic Ex.:  Supine rhythmic stabilization while holding 5# DB with shoulder flexed to 90 degrees, 2x30 seconds  Supine Serratus Punches, 5#, 2 x 10  Supine Active D2 shoulder flexion pattern against gentle manual resistance from therapist, 1x8  Supine flexion+extension with 3# DB, 2x10 each  Staning ER/IR against RTB, 2x10 each  Supine bicep curls and tricep extensions, 5# dumbbell, 2 x 10  Standing Nautilus Retractions (2x15) and Tricep extensions (2x8), 40# each  Standing Nautilus Pulldowns, 40#, 2 x 10  Therapeutic Activity:  Farmers  Carry with 9# Dumbbell in LUE, 5 passes down and back in gym  Lifting dumbbells (ranging from 1# to 5#) from shelf at eye level to shelf overhead, 2x total   PATIENT EDUCATION:  Education details: Pt. Educated on diagnosis, prognosis, anatomy involved, and POC. Pt. And husband of pt. Educated on HEP. Person educated: Patient and Spouse Education method: Explanation, Demonstration, Tactile cues, Verbal cues, and Handouts Education comprehension: verbalized understanding and returned demonstration   HOME EXERCISE PROGRAM: Access Code: [email or web address] URL: [email or web address] Date: 10/11/2023 Prepared by: Ozell Sero Exercises - Supine Shoulder Flexion Extension AAROM with Dowel - 1 x daily - 7 x weekly - 2 sets - 10 reps - Seated Shoulder Abduction AAROM with Dowel - 1 x daily - 7 x weekly - 2 sets - 10 reps - Seated Scapular Retraction - 1 x daily - 4-5 x weekly - 2 sets - 12 reps - Standing Isometric Shoulder External Rotation with Doorway - 1 x daily - 4-5 x weekly - 2 sets - 8 reps - Standing Isometric Shoulder Abduction with Doorway - Arm Bent - 1 x daily - 4-5 x weekly - 2 sets - 8 reps    Access Code: [email or web address] URL: [email or web address] Date: 10/18/2023 Prepared by: Ozell Sero Exercises - Supine Shoulder Flexion Extension AAROM with Dowel - 1 x daily - 7 x weekly - 2 sets - 10 reps - Seated Shoulder Abduction AAROM with Dowel - 1 x daily - 7 x weekly - 2 sets - 10 reps - Standing Isometric Shoulder External Rotation with Doorway - 1 x daily - 4-5 x weekly - 2 sets - 8 reps - Standing Isometric Shoulder Abduction with Doorway - Arm Bent - 1 x daily - 4-5 x weekly - 2 sets - 8 reps - Seated Shoulder Row with Anchored Resistance - 1 x daily - 4-5 x weekly - 2 sets - 12 reps - Seated Shoulder Extension and Scapular Retraction with Resistance - 1 x daily - 4-5 x weekly - 2 sets - 12 reps - Supine Bilateral Elbow Flexion Extension with Dumbbells - 1 x daily - 7 x weekly - 3  sets - 8 reps - Supine Scapular Protraction in Flexion with Dumbbells - 1 x daily - 7 x weekly - 2 sets - 10 reps    ASSESSMENT:   CLINICAL IMPRESSION: Pt was progressed to completing supine rhythmic stabilization, standing pulldowns on Nautilus, and supine serratus punches for increased resistance on this day which she tolerated well. Pt was introduced to two new activities on this day which were lifting dumbbells (ranging from 1# to 5#) from shelf at eye level to shelf overhead and farmers carry with 9# DB which she tolerated well, Pt did experience fatigue in anterior shoulder region after completion of farmers carries and was noted to have increased involvement of left upper trap musculature at times (but was able to correct with verbal cueing) so initiated STM to the region with use of Hypervolt in seated position which pt responded well to. Pt continues to demonstrate improvements with UE strengthening and ROM but is most limited by active left shoulder IR at this time. Will continue to monitor pt. symptoms and progress as able. Plan for final discharge from physical therapy at next visit on Monday 11/28/2023.      OBJECTIVE IMPAIRMENTS: decreased ROM, decreased strength, and pain.    ACTIVITY LIMITATIONS: carrying, lifting, and dressing   PARTICIPATION LIMITATIONS: meal prep, cleaning, laundry, driving, and occupation   PERSONAL FACTORS:  1-2 comorbidities: CKD and DM are also affecting patient's functional outcome.    REHAB POTENTIAL: Good   CLINICAL DECISION MAKING: Evolving/moderate complexity   EVALUATION COMPLEXITY: Moderate     GOALS: Goals reviewed with patient? Yes   SHORT TERM GOALS: Target date: 11/01/23   Pt. To become independent with HEP to increase active left shoulder abduction to at least 100 deg. to improve functional reaching/ ADLs. Baseline: Goal status: Goal met   2.  Pt. will improve gross left UE strength to at least a 4/5 in order to improve ability to  drive her car which she does a lot of for work.  Baseline:  Goal status: Partially met   LONG TERM GOALS: Target date: 12/06/23   Pt. Will improve QuickDash score by at least 8 points (MCID) in order for pt. To allow for completion of  daily activities with greater ease. Baseline: 09/16: 18.2% Goal status: On-going   2.  Pt. To experience 0/10 pain during functional care activities while at home such as washing dishes, cooking, and cleaning.  Baseline: ~3/10 twitch pain Goal status: INITIAL   3.  Pt. to be able to actively elevate left shoulder to at least 140 degrees to improve ability to reach for items in top cabinets of kitchen and bathroom.  Baseline: 122 deg. of active flexion Goal status: INITIAL   PLAN:   PT FREQUENCY: 2x/week   PT DURATION: 8 weeks   PLANNED INTERVENTIONS: 97164- PT Re-evaluation, 97110-Therapeutic exercises, 97530- Therapeutic activity, [id number]- Neuromuscular re-education, 97535- Self Care, [id number]- Manual therapy, Patient/Family education, Joint mobilization, Cryotherapy, and Moist heat.   PLAN FOR NEXT SESSION: Increase periscapular endurance muscle capacity with increased time/reps for next session. Plan is for Monday 27th, 2025 to be final visit as patient will be leaving for 3 week vacation. Finalize goals    Ozell JAYSON Sero, PT, DPT # [id number] [employer] Bracket, SPT 11/25/2023

## 2023-11-28 ENCOUNTER — Ambulatory Visit: Admitting: Physical Therapy

## 2023-11-28 ENCOUNTER — Encounter: Payer: Self-pay | Admitting: Physical Therapy

## 2023-11-28 DIAGNOSIS — Z96612 Presence of left artificial shoulder joint: Secondary | ICD-10-CM

## 2023-11-28 DIAGNOSIS — M25512 Pain in left shoulder: Secondary | ICD-10-CM | POA: Diagnosis not present

## 2023-11-28 DIAGNOSIS — G8929 Other chronic pain: Secondary | ICD-10-CM

## 2023-11-28 DIAGNOSIS — M6281 Muscle weakness (generalized): Secondary | ICD-10-CM

## 2023-11-28 DIAGNOSIS — M25612 Stiffness of left shoulder, not elsewhere classified: Secondary | ICD-10-CM

## 2023-11-28 NOTE — Therapy (Signed)
 OUTPATIENT PHYSICAL THERAPY SHOULDER TREATMENT/ DISCHARGE   Patient Name: Belinda Frazier MRN: [id number] DOB:05/24/1955, 68 y.o., female Today's Date: 11/28/2023   END OF SESSION:  PT End of Session - 11/28/23 1119     Visit Number 8    Number of Visits 16    Date for Recertification  12/06/23    PT Start Time 1030    PT Stop Time 1115    PT Time Calculation (min) 45 min    Activity Tolerance Patient tolerated treatment well;No increased pain;Patient limited by fatigue    Behavior During Therapy [facility] for tasks assessed/performed                Past Medical History:  Diagnosis Date   Anemia     Arthritis     Breast cancer (HCC) left breast    bilateral mastectomy   Chronic kidney disease      stage III   Diabetes mellitus without complication [facility])               Past Surgical History:  Procedure Laterality Date   CESAREAN SECTION   1990    x2 [phone]   CHOLECYSTECTOMY        2019   MASTECTOMY, RADICAL Bilateral 1996   TOTAL KNEE ARTHROPLASTY Left 12/21/2022    Procedure: TOTAL KNEE ARTHROPLASTY;  Surgeon: Ernie Cough, MD;  Location: [facility];  Service: Orthopedics;  Laterality: Left;            Patient Active Problem List    Diagnosis Date Noted   S/P total knee arthroplasty, 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      PCP: Sherial Bail, MD   REFERRING PROVIDER: Kay Kemps, MD   REFERRING DIAG: [phone] (ICD-10-CM) - Encounter for other specified surgical aftercare    Rationale for Evaluation and Treatment: Rehabilitation   THERAPY DIAG:  Muscle weakness (generalized)   Shoulder joint stiffness, left   Chronic left shoulder pain   S/P reverse total shoulder arthroplasty, left   ONSET DATE: s/p left reverse total shoulder arthroplasty August 18th, 2025   SUBJECTIVE:                                                                                                                                                                                             SUBJECTIVE STATEMENT: Pt. Is a 68 year old female presenting to physical therapy s/p reverse total shoulder arthroplasty of left shoulder on (09/19/23). Pt. Experiencing ~1-2/10 twitch/twinging type of pain over the anterior shaft of the humerus. Pt. States that this number can increase to a slightly higher number (3/10 NPS) if she is reaching overhead  or internally rotating her arm when changing clothes or washing dishes. Pt. Denies any distal symptoms, numbness/tingling or burning sensations, pain over the humeral head or upper traps region, or any recent falls. Pt. Does report mild discomfort at the site of the incision and states that the inferior aspect of the incision feels hard. Pt. States that she is no longer taking any pain medication and that she only had to take Hydrocodone immediately after the surgery on 09/09. Pt. Reports that she currently has no post-surgical restrictions other than lifting heavy objects. Pt. States that she was told by MD that she can return to driving (sticking to shorter durations at this time) but she has yet to attempt. Pt. 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    PERTINENT HISTORY:  Pt. Previously seen in physical therapy for left shoulder pain after a trip/fall over her dog in March of 2025.    PAIN:  Are you having pain? Yes: NPRS scale: 2/10 Pain location: Anterior shaft of left humerus  Pain description: Twitching type ache Aggravating factors: Reaching tasks involving shoulder flexion or IR such as reaching for top cabinets, taking off jacket, or combing her hair Relieving factors: Rest, ice on rare occasions   PRECAUTIONS:  None   RED FLAGS: None       WEIGHT BEARING RESTRICTIONS:  No   FALLS:  Has patient fallen in last 6 months? No   LIVING ENVIRONMENT: Lives with: lives with their spouse Lives in: House/apartment Stairs: Yes: External: 7 steps; bilateral but cannot  reach both Has following equipment at home: Single point cane, Walker - 2 wheeled, and Ramped entry   OCCUPATION:  Sports coach for [employer].   PLOF:  Independent   PATIENT GOALS:  To increase strength and ROM of left shoulder so that she can drive, wash dishes, cook, and change her clothes without pain.    NEXT MD VISIT: October 7th, 2025     OBJECTIVE:  Note: Objective measures were completed at Evaluation unless otherwise noted.   PATIENT SURVEYS:  Will assess QuickDash during next session.    COGNITIVE STATUS: Within functional limits for tasks assessed        SENSATION: Light touch: WFL   SKIN INTEGRITY:             Incision site demonstrating proper healing   POSTURE:  rounded shoulders and forward head   PALPATION: Mild tenderness noted over left anterior deltoid into proximal biceps region   HAND DOMINANCE:  Right   UPPER EXTREMITY ROM:    Active ROM Right eval Left eval  Shoulder flexion 185 deg.  122 deg.  Shoulder extension      Shoulder abduction   160 deg. 89 deg.  Shoulder adduction      Shoulder internal rotation   76 deg.  70 deg.  Shoulder external rotation   91 deg. 40 deg.  Elbow flexion      Elbow extension      Wrist flexion      Wrist extension      Wrist ulnar deviation      Wrist radial deviation      Wrist pronation      Wrist supination      (Blank rows = not tested)   UPPER EXTREMITY MMT:   MMT Right eval Left eval  Shoulder flexion 4  Deferred  Shoulder extension      Shoulder  abduction  4- Deferred  Shoulder adduction      Shoulder internal rotation 4+ 3 *  Shoulder external rotation  4+  3 *  Middle trapezius      Lower trapezius      Elbow flexion  4+ 4- *  Elbow extension  4+  4  Wrist flexion      Wrist extension      Wrist ulnar deviation      Wrist radial deviation      Wrist pronation 5  5   Wrist supination  5  5  Grip strength (lbs)      (Blank rows = not tested)             Cervical AROM  Assessment: Flexion, Bilateral flexion, bilateral lateral rotation, extension: [facility]  10/18/23 QuickDash: 18.2%       11/04/2023 MMT Right eval Left eval  Shoulder flexion 4  3+ (pain)  Shoulder extension      Shoulder abduction  4- 4-  Shoulder adduction      Shoulder internal rotation 4+ 3+   Shoulder external rotation  4+  3 +  Middle trapezius      Lower trapezius      Elbow flexion  4+ 4-  Elbow extension  4+  4+  Wrist flexion      Wrist extension      Wrist ulnar deviation      Wrist radial deviation      Wrist pronation 5  5   Wrist supination  5  5  Grip strength (lbs)      (Blank rows = not tested)    Active ROM Right eval Left eval  Shoulder flexion 185 deg.  139 deg.  Shoulder extension      Shoulder abduction   160 deg. 135 deg.  Shoulder adduction      Shoulder internal rotation   76 deg.  70 deg.  Shoulder external rotation   91 deg. 60 deg.                                                                                                        TREATMENT DATE: 11/28/2023  Subjective: Pt reports no pain at rest in her left shoulder at start of tx session. Pt does report that she was experienced mild pain and severe soreness over anterior aspect of left shoulder following last tx session which she attributes to Farmer's carry with 9# DB.  Manual: Supine PROM of left shoulder: flexion, scaption, abduction, ER, IR (no increased pain) STM to left upper traps region with use of Hypervolt in seated position   Therapeutic Ex.:  Supine rhythmic stabilization while holding 5# DB with shoulder flexed to 90 degrees, 2x30 seconds  Supine Serratus Punches, 5#, 2 x 12  Supine Active D2 shoulder flexion pattern against gentle manual resistance from therapist, 1x8  Supine flexion+extension with 3# DB, 2x10 each  Seated scaption with 3# dumbbell, 2 x 10  Staning ER/IR against RTB, 2x10 each  Supine tricep extensions, 5# dumbbell, 2 x 10  Standing Nautilus  Retractions (2x15) and Tricep extensions (2x8), 40# each  Standing Nautilus Pulldowns, 40#, 2 x 10  Farmers Carry with 7# Dumbbell in LUE, 5 passes down and back in gym   11/28/2023:  QuickDash: 15.9% Active Left shoulder flexion: 139 degrees Active Left shoulder abduction: 148 degrees  Discussed and finalized HEP/progression at home/completion and management of symptoms during upcoming trip  PATIENT EDUCATION:  Education details: Pt. Educated on diagnosis, prognosis, anatomy involved, and POC. Pt. And husband of pt. Educated on HEP. Person educated: Patient and Spouse Education method: Explanation, Demonstration, Tactile cues, Verbal cues, and Handouts Education comprehension: verbalized understanding and returned demonstration   HOME EXERCISE PROGRAM: Access Code: [email or web address] URL: [email or web address] Date: 10/11/2023 Prepared by: Ozell Sero Exercises - Supine Shoulder Flexion Extension AAROM with Dowel - 1 x daily - 7 x weekly - 2 sets - 10 reps - Seated Shoulder Abduction AAROM with Dowel - 1 x daily - 7 x weekly - 2 sets - 10 reps - Seated Scapular Retraction - 1 x daily - 4-5 x weekly - 2 sets - 12 reps - Standing Isometric Shoulder External Rotation with Doorway - 1 x daily - 4-5 x weekly - 2 sets - 8 reps - Standing Isometric Shoulder Abduction with Doorway - Arm Bent - 1 x daily - 4-5 x weekly - 2 sets - 8 reps   Access Code: [email or web address] URL: [email or web address] Date: 10/18/2023 Prepared by: Ozell Sero Exercises - Supine Shoulder Flexion Extension AAROM with Dowel - 1 x daily - 7 x weekly - 2 sets - 10 reps - Seated Shoulder Abduction AAROM with Dowel - 1 x daily - 7 x weekly - 2 sets - 10 reps - Standing Isometric Shoulder External Rotation with Doorway - 1 x daily - 4-5 x weekly - 2 sets - 8 reps - Standing Isometric Shoulder Abduction with Doorway - Arm Bent - 1 x daily - 4-5 x weekly - 2 sets - 8 reps - Seated Shoulder Row with Anchored Resistance -  1 x daily - 4-5 x weekly - 2 sets - 12 reps - Seated Shoulder Extension and Scapular Retraction with Resistance - 1 x daily - 4-5 x weekly - 2 sets - 12 reps - Supine Bilateral Elbow Flexion Extension with Dumbbells - 1 x daily - 7 x weekly - 3 sets - 8 reps - Supine Scapular Protraction in Flexion with Dumbbells - 1 x daily - 7 x weekly - 2 sets - 10 reps     ASSESSMENT:   CLINICAL IMPRESSION: Pt was progressed to completing supine serratus punches for increased reps on this day which she tolerated well. Modified farmers carry with decreased weight (7# DB) due to extreme soreness after last session and patient wanting to keep residual soreness from tx session to a minimum in preparation for upcoming trip. Pt introduced to seated scaption with 3# dumbbell which she was able to complete without an increase in pain but did note fatigue during finals reps. Measured AROM of left shoulder (see above) and QuickDash on this day which improved to a 15.9%.   Pt has attended and actively participated in 8 treatment sessions. Pt has seen improvements with increased AROM of left shoulder, decreased pain levels, and increased gross UE strength. Patient to be discharged from physical therapy services at this time after achieving most of her goals and returning to work/daily activities/desired recreational activities without significant difficulty with plans to continue HEP independently.      OBJECTIVE IMPAIRMENTS:  decreased ROM, decreased strength, and pain.    ACTIVITY LIMITATIONS: carrying, lifting, and dressing   PARTICIPATION LIMITATIONS: meal prep, cleaning, laundry, driving, and occupation   PERSONAL FACTORS: 1-2 comorbidities: CKD and DM are also affecting patient's functional outcome.    REHAB POTENTIAL: Good   CLINICAL DECISION MAKING: Evolving/moderate complexity   EVALUATION COMPLEXITY: Moderate     GOALS: Goals reviewed with patient? Yes   SHORT TERM GOALS: Target date: 11/01/23   Pt. To  become independent with HEP to increase active left shoulder abduction to at least 100 deg. to improve functional reaching/ ADLs. Baseline: Goal status: GOAL MET  2.  Pt. will improve gross left UE strength to at least a 4/5 in order to improve ability to drive her car which she does a lot of for work.  Baseline:  Goal status: GOAL MET   LONG TERM GOALS: Target date: 12/06/23   Pt. Will improve QuickDash score by at least 8 points (MCID) in order for pt. To allow for completion of  daily activities with greater ease. Baseline: 09/16: 18.2%; (10/27): 15.9% Goal status: Not Met   2.  Pt. To experience 0/10 pain during functional care activities while at home such as washing dishes, cooking, and cleaning.  Baseline: ~3/10 twitch pain; (10/27): 0/10 pain at its worst Goal status: GOAL MET   3.  Pt. to be able to actively elevate left shoulder to at least 140 degrees to improve ability to reach for items in top cabinets of kitchen and bathroom.  Baseline: 122 deg. of active flexion; (10/27): 139 deg of active flexion, 148 deg of active abduction Goal status: GOAL MET   PLAN:   PT FREQUENCY: 2x/week   PT DURATION: 8 weeks   PLANNED INTERVENTIONS: 97164- PT Re-evaluation, 97110-Therapeutic exercises, 97530- Therapeutic activity, [id number]- Neuromuscular re-education, 97535- Self Care, [id number]- Manual therapy, Patient/Family education, Joint mobilization, Cryotherapy, and Moist heat.   PLAN FOR NEXT SESSION: Patient to be discharged from physical therapy at this time.    Ozell JAYSON Sero, PT, DPT # [id number] [place] Bracket, SPT 11/28/2023

## 2024-02-13 ENCOUNTER — Other Ambulatory Visit: Payer: Self-pay | Admitting: Internal Medicine

## 2024-02-13 DIAGNOSIS — R1319 Other dysphagia: Secondary | ICD-10-CM

## 2024-02-16 ENCOUNTER — Ambulatory Visit
Admission: RE | Admit: 2024-02-16 | Discharge: 2024-02-16 | Disposition: A | Discharge status: Home / Self Care | Payer: Self-pay | Source: Ambulatory Visit | Attending: Internal Medicine | Admitting: Internal Medicine

## 2024-02-16 DIAGNOSIS — R1319 Other dysphagia: Secondary | ICD-10-CM | POA: Diagnosis present
